# Patient Record
Sex: Female | Born: 1965 | Race: Black or African American | Hispanic: No | Marital: Single | Smoking: Former smoker
Health system: Southern US, Community
[De-identification: ages and names within clinical notes are randomized; demographics above are authoritative.]

## PROBLEM LIST (undated history)

## (undated) DIAGNOSIS — I10 Essential (primary) hypertension: Secondary | ICD-10-CM

## (undated) DIAGNOSIS — E781 Pure hyperglyceridemia: Secondary | ICD-10-CM

## (undated) DIAGNOSIS — E559 Vitamin D deficiency, unspecified: Secondary | ICD-10-CM

## (undated) DIAGNOSIS — T7840XA Allergy, unspecified, initial encounter: Secondary | ICD-10-CM

## (undated) DIAGNOSIS — G43909 Migraine, unspecified, not intractable, without status migrainosus: Secondary | ICD-10-CM

## (undated) DIAGNOSIS — K219 Gastro-esophageal reflux disease without esophagitis: Secondary | ICD-10-CM

## (undated) DIAGNOSIS — E119 Type 2 diabetes mellitus without complications: Secondary | ICD-10-CM

## (undated) DIAGNOSIS — M1990 Unspecified osteoarthritis, unspecified site: Secondary | ICD-10-CM

## (undated) DIAGNOSIS — D649 Anemia, unspecified: Secondary | ICD-10-CM

## (undated) DIAGNOSIS — M719 Bursopathy, unspecified: Secondary | ICD-10-CM

## (undated) DIAGNOSIS — G4730 Sleep apnea, unspecified: Secondary | ICD-10-CM

## (undated) HISTORY — DX: Anemia, unspecified: D64.9

## (undated) HISTORY — DX: Essential (primary) hypertension: I10

## (undated) HISTORY — PX: ABDOMINAL HYSTERECTOMY: SHX81

## (undated) HISTORY — DX: Gastro-esophageal reflux disease without esophagitis: K21.9

## (undated) HISTORY — DX: Vitamin D deficiency, unspecified: E55.9

## (undated) HISTORY — DX: Allergy, unspecified, initial encounter: T78.40XA

## (undated) HISTORY — DX: Sleep apnea, unspecified: G47.30

## (undated) HISTORY — DX: Pure hyperglyceridemia: E78.1

## (undated) HISTORY — DX: Unspecified osteoarthritis, unspecified site: M19.90

---

## 1998-11-14 ENCOUNTER — Other Ambulatory Visit: Admission: RE | Admit: 1998-11-14 | Discharge: 1998-11-14 | Payer: Self-pay | Admitting: *Deleted

## 1999-10-30 ENCOUNTER — Emergency Department (HOSPITAL_COMMUNITY): Admission: EM | Admit: 1999-10-30 | Discharge: 1999-10-30 | Payer: Self-pay | Admitting: Emergency Medicine

## 2000-02-28 ENCOUNTER — Emergency Department (HOSPITAL_COMMUNITY): Admission: EM | Admit: 2000-02-28 | Discharge: 2000-02-29 | Payer: Self-pay | Admitting: Emergency Medicine

## 2000-03-28 ENCOUNTER — Emergency Department (HOSPITAL_COMMUNITY): Admission: EM | Admit: 2000-03-28 | Discharge: 2000-03-28 | Payer: Self-pay | Admitting: *Deleted

## 2003-03-16 ENCOUNTER — Emergency Department (HOSPITAL_COMMUNITY): Admission: EM | Admit: 2003-03-16 | Discharge: 2003-03-16 | Payer: Self-pay | Admitting: Emergency Medicine

## 2004-03-21 ENCOUNTER — Emergency Department (HOSPITAL_COMMUNITY): Admission: EM | Admit: 2004-03-21 | Discharge: 2004-03-22 | Payer: Self-pay | Admitting: Emergency Medicine

## 2004-04-23 ENCOUNTER — Emergency Department (HOSPITAL_COMMUNITY): Admission: EM | Admit: 2004-04-23 | Discharge: 2004-04-23 | Payer: Self-pay | Admitting: Emergency Medicine

## 2004-04-26 ENCOUNTER — Inpatient Hospital Stay (HOSPITAL_COMMUNITY): Admission: EM | Admit: 2004-04-26 | Discharge: 2004-04-28 | Payer: Self-pay | Admitting: Emergency Medicine

## 2004-06-12 ENCOUNTER — Ambulatory Visit (HOSPITAL_COMMUNITY): Admission: RE | Admit: 2004-06-12 | Discharge: 2004-06-12 | Payer: Self-pay | Admitting: Otolaryngology

## 2005-02-12 ENCOUNTER — Emergency Department (HOSPITAL_COMMUNITY): Admission: EM | Admit: 2005-02-12 | Discharge: 2005-02-13 | Payer: Self-pay | Admitting: Emergency Medicine

## 2005-02-25 ENCOUNTER — Other Ambulatory Visit: Admission: RE | Admit: 2005-02-25 | Discharge: 2005-02-25 | Payer: Self-pay | Admitting: Gynecology

## 2005-04-09 ENCOUNTER — Inpatient Hospital Stay (HOSPITAL_COMMUNITY): Admission: RE | Admit: 2005-04-09 | Discharge: 2005-04-11 | Payer: Self-pay | Admitting: Gynecology

## 2005-04-09 ENCOUNTER — Encounter (INDEPENDENT_AMBULATORY_CARE_PROVIDER_SITE_OTHER): Payer: Self-pay | Admitting: Specialist

## 2005-12-31 ENCOUNTER — Emergency Department (HOSPITAL_COMMUNITY): Admission: EM | Admit: 2005-12-31 | Discharge: 2005-12-31 | Payer: Self-pay | Admitting: Emergency Medicine

## 2006-03-03 ENCOUNTER — Other Ambulatory Visit: Admission: RE | Admit: 2006-03-03 | Discharge: 2006-03-03 | Payer: Self-pay | Admitting: Gynecology

## 2006-12-14 ENCOUNTER — Emergency Department (HOSPITAL_COMMUNITY): Admission: EM | Admit: 2006-12-14 | Discharge: 2006-12-14 | Payer: Self-pay | Admitting: Emergency Medicine

## 2006-12-16 ENCOUNTER — Emergency Department (HOSPITAL_COMMUNITY): Admission: EM | Admit: 2006-12-16 | Discharge: 2006-12-16 | Payer: Self-pay | Admitting: Family Medicine

## 2007-11-30 ENCOUNTER — Emergency Department (HOSPITAL_COMMUNITY): Admission: EM | Admit: 2007-11-30 | Discharge: 2007-11-30 | Payer: Self-pay | Admitting: Emergency Medicine

## 2007-12-02 ENCOUNTER — Emergency Department (HOSPITAL_COMMUNITY): Admission: EM | Admit: 2007-12-02 | Discharge: 2007-12-02 | Payer: Self-pay | Admitting: Family Medicine

## 2008-03-17 ENCOUNTER — Emergency Department (HOSPITAL_COMMUNITY): Admission: EM | Admit: 2008-03-17 | Discharge: 2008-03-18 | Payer: Self-pay | Admitting: Emergency Medicine

## 2008-05-27 ENCOUNTER — Emergency Department (HOSPITAL_COMMUNITY): Admission: EM | Admit: 2008-05-27 | Discharge: 2008-05-27 | Payer: Self-pay | Admitting: Emergency Medicine

## 2009-02-28 ENCOUNTER — Emergency Department (HOSPITAL_COMMUNITY): Admission: EM | Admit: 2009-02-28 | Discharge: 2009-02-28 | Payer: Self-pay | Admitting: Family Medicine

## 2009-06-28 ENCOUNTER — Emergency Department (HOSPITAL_COMMUNITY): Admission: EM | Admit: 2009-06-28 | Discharge: 2009-06-28 | Payer: Self-pay | Admitting: Emergency Medicine

## 2009-06-29 ENCOUNTER — Emergency Department (HOSPITAL_COMMUNITY): Admission: EM | Admit: 2009-06-29 | Discharge: 2009-06-29 | Payer: Self-pay | Admitting: Emergency Medicine

## 2009-12-25 ENCOUNTER — Emergency Department (HOSPITAL_COMMUNITY): Admission: EM | Admit: 2009-12-25 | Discharge: 2009-12-25 | Payer: Self-pay | Admitting: Emergency Medicine

## 2010-08-08 LAB — COMPREHENSIVE METABOLIC PANEL
ALT: 55 U/L — ABNORMAL HIGH (ref 0–35)
AST: 40 U/L — ABNORMAL HIGH (ref 0–37)
Albumin: 3.6 g/dL (ref 3.5–5.2)
Alkaline Phosphatase: 67 U/L (ref 39–117)
BUN: 9 mg/dL (ref 6–23)
CO2: 24 mEq/L (ref 19–32)
Calcium: 8.1 mg/dL — ABNORMAL LOW (ref 8.4–10.5)
Chloride: 112 mEq/L (ref 96–112)
Creatinine, Ser: 1.15 mg/dL (ref 0.4–1.2)
GFR calc Af Amer: >60 mL/min (ref >60)
GFR calc non Af Amer: 52 mL/min — ABNORMAL LOW (ref >60)
Glucose, Bld: 96 mg/dL (ref 70–99)
Potassium: 3.1 mEq/L — ABNORMAL LOW (ref 3.5–5.1)
Sodium: 141 mEq/L (ref 135–145)
Total Bilirubin: 0.4 mg/dL (ref 0.3–1.2)
Total Protein: 6.4 g/dL (ref 6.0–8.3)

## 2010-08-08 LAB — CBC
HCT: 38 % (ref 36.0–46.0)
Hemoglobin: 13.1 g/dL (ref 12.0–15.0)
MCHC: 34.6 g/dL (ref 30.0–36.0)
MCV: 87.8 fL (ref 78.0–100.0)
Platelets: 160 10*3/uL (ref 150–400)
RBC: 4.33 MIL/uL (ref 3.87–5.11)
RDW: 13.3 % (ref 11.5–15.5)
WBC: 3.9 10*3/uL — ABNORMAL LOW (ref 4.0–10.5)

## 2010-08-08 LAB — DIFFERENTIAL
Basophils Absolute: 0 10*3/uL (ref 0.0–0.1)
Basophils Relative: 0 % (ref 0–1)
Eosinophils Absolute: 0 10*3/uL (ref 0.0–0.7)
Eosinophils Relative: 1 % (ref 0–5)
Lymphocytes Relative: 19 % (ref 12–46)
Lymphs Abs: 0.7 10*3/uL (ref 0.7–4.0)
Monocytes Absolute: 0.4 10*3/uL (ref 0.1–1.0)
Monocytes Relative: 10 % (ref 3–12)
Neutro Abs: 2.7 10*3/uL (ref 1.7–7.7)
Neutrophils Relative %: 70 % (ref 43–77)

## 2010-10-04 NOTE — Op Note (Signed)
Debra Welch, Debra Welch             ACCOUNT NO.:  [id number]   MEDICAL RECORD NO.:  [id number]          PATIENT TYPE:  INP   LOCATION:  9399                          FACILITY:  [facility]   PHYSICIAN:  Timothy P. Fontaine, M.D.DATE OF BIRTH:  Jul 10, 1965   DATE OF PROCEDURE:  04/09/2005  DATE OF DISCHARGE:                                 OPERATIVE REPORT   PREOPERATIVE DIAGNOSES:  Menometrorrhagia leiomyomata.   POSTOPERATIVE DIAGNOSES:  Menometrorrhagia leiomyomata.   PROCEDURE:  Total abdominal hysterectomy.   SURGEON:  Timothy P. Fontaine, M.D.   ASSISTANT:  Rande Brunt. Eda Paschal, M.D.   ANESTHESIA:  General.   ESTIMATED BLOOD LOSS:  Less than 100 cc.   COMPLICATIONS:  None.   SPECIMEN:  Uterus.   FINDINGS:  Enlarged at 18 weeks size uterus approx 1466 gr, palpable myomas,  right and left fallopian tubes and the right and left ovaries grossly  normal. No evidence of pelvic adhesive disease or endometriosis.  Upper  abdominal exam palpably normal.  Appendix visualized and normal.   PROCEDURE:  The patient was taken to the operating room, underwent an  abdominal perineal vaginal preparation with Betadine scrub and Betadine  solution per nursing personnel, and indwelling Foley catheterization was  performed under sterile technique. 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 The uterus was then elevated  through the incision. The right and left the uterine ovarian pedicles were  identified, doubly clamped, cut and doubly ligated using 0 Vicryl suture.  Balfour retractor and bladder blade were placed within the incision.  The  right round ligament was transected with electrocautery, and the anterior  vesicouterine peritoneal fold was sharply incised, and the bladder flap was  sharply developed. A similar procedure was carried out on the other side.  The right uterine  vessels were then skeletonized, clamped, cut and doubly  ligated using 0 Vicryl suture. A similar procedure was carried out on the  other side.  The supracervical hysterectomy was then performed to allow  visualization of the cervix and subsequently the cervical stump was  progressively freed from its attachments through clamping, cutting and  ligating of the paracervical tissues cardinal ligaments using 0 Vicryl  suture.  The upper vagina was cross clamped, and the cervix was excised  without difficulty. Right and left vaginal angle sutures were placed using 0  Vicryl suture, and the vagina was closed anterior to posterior using two  figure-of-eight interrupted sutures. The pelvis was then copiously  irrigated.  All pedicles reinspected showing adequate hemostasis.  The  previously placed bowel packing was removed.  Balfour retractor and bladder  blade were removed.  The anterior fascia was reapproximated using 0 Vicryl  suture running stitch starting at the angle, meeting in the middle.  Subcutaneous tissues were copiously irrigated.  Adequate hemostasis was  achieved without electrocautery.  Skin reapproximated  using 4-0 Vicryl with a running subcuticular stitch.  Steri-Strips, Benzoin  applied. Sterile dressing applied.  The patient was awakened without  difficulty and taken to  recovery in good condition having tolerated  procedure well with free-flowing clear yellow urine.      Timothy P. Fontaine, M.D.  Electronically Signed     TPF/MEDQ  D:  04/09/2005  T:  04/09/2005  Job:  [id number]

## 2010-10-04 NOTE — Discharge Summary (Signed)
Debra Welch, Debra Welch             ACCOUNT NO.:  [id number]   MEDICAL RECORD NO.:  [id number]          PATIENT TYPE:  INP   LOCATION:  9315                          FACILITY:  [facility]   PHYSICIAN:  Timothy P. Fontaine, M.D.DATE OF BIRTH:  09-Mar-1966   DATE OF ADMISSION:  04/09/2005  DATE OF DISCHARGE:  04/11/2005                                 DISCHARGE SUMMARY   DISCHARGE DIAGNOSES:  1.  Menometrorrhagia.  2.  Leiomyomata.   PROCEDURE:  Total abdominal hysterectomy, April 09, 2005, pathology  pending.   HOSPITAL COURSE:  [age]-year-old G3, P3 female, status post tubal  sterilization with menometrorrhagia and leiomyomata, underwent uncomplicated  total abdominal hysterectomy, April 09, 2005.  The patient's  postoperative course was uncomplicated and she was discharged on  postoperative day #2, ambulating well, tolerating a regular diet, with a  postoperative hemoglobin of 7.5.  Her preoperative hemoglobin was 9.1.  The  patient received precautions, instructions and followup.  She will be seen  in the office in 2 weeks.   DISCHARGE MEDICATIONS:  1.  To resume oral iron as an outpatient.   Received a prescription for:  1.  Tylox #20, one to two p.o. q.4-6 h.  2.  Motrin 800 mg one p.o. q.8 h. p.r.n. pain, #30,  3.  Ambien at her request, 10 mg, #15, one p.o. nightly p.r.n. insomnia.      Timothy P. Fontaine, M.D.  Electronically Signed     TPF/MEDQ  D:  04/11/2005  T:  04/11/2005  Job:  [id number]

## 2010-10-04 NOTE — H&P (Signed)
NAME:  Debra Welch, Debra Welch             ACCOUNT NO.:  [id number]   MEDICAL RECORD NO.:  [id number]          PATIENT TYPE:  INP   LOCATION:  NA                            FACILITY:  [facility]   PHYSICIAN:  Timothy P. Fontaine, M.D.DATE OF BIRTH:  01-06-1966   DATE OF ADMISSION:  04/09/2005  DATE OF DISCHARGE:                                HISTORY & PHYSICAL   CHIEF COMPLAINT:  Menometrorrhagia.   HISTORY OF PRESENT ILLNESS:  A 45 year old G18, P3 female with a history of  menometrorrhagia, leiomyomata for TAH. The patient was evaluated in the  emergency room due to heavy frequent bleeding.  The patient was found to  have probable leiomyomata and was referred for evaluation. Exam revealed a  16 week size, irregular uterus, with ultrasound confirming multiple myomas,  and she is admitted at this time for TAH. She is status post postpartum BTL.  She is currently on Megace 40 milligrams daily for menstrual suppression.   PAST MEDICAL HISTORY:  Uncomplicated.   PAST SURGICAL HISTORY:  Tubal sterilization in 1992.   ALLERGIES:  DEMEROL.   REVIEW OF SYSTEMS:  Noncontributory.   FAMILY HISTORY:  Noncontributory.   SOCIAL HISTORY:  Noncontributory.   CURRENT MEDICATIONS:  1.  Megace 40 milligrams daily.  2.  Iron supplementation.   PHYSICAL EXAMINATION:  VITAL SIGNS:  Afebrile. Vital signs are stable.  HEENT: Normal.  LUNGS:  Clear.  CARDIAC:  Regular rate.  No rubs, murmurs, or gallops.  ABDOMEN:  Benign.  PELVIC:  External BUS, vagina normal. Cervix normal. Uterus 16 week size,  bulky.  Adnexa without masses or tenderness.   ASSESSMENT:  A 45 year old G56, P3 female with menometrorrhagia, leiomyomata  for TAH. I reviewed the risks, benefits, indications and alternatives for  the proposed surgery to include the expected intraoperative and  postoperative courses. She understands that hysterectomy is absolute and  irreversible sterility. We also discussed sexuality following hysterectomy  and the possibilities of persistent orgasmic dysfunction, as well as  persistent dyspareunia was discussed, understood, and accepted. The ovarian  conservation issue was also discussed with her, and the issues of keeping  one or both ovaries versus removing them were reviewed, understood, and  accepted. She understands that by keeping her ovaries she is at risk for  ovarian disease in the future to include both benign and ovarian carcinoma,  and she understands by removing them she is at risk for hypoestrogenic  symptoms, ERT and risks of ERT including stroke, heart attack, DVT and  breast cancer issues. The patient desires to keep both ovaries, although she  does give me permission to remove one or both ovaries at the time of surgery  if, in my judgment, it is wise to do so. I reviewed with her also the  realistic risk of transfusion. Her hemoglobin was 8.3.  Previously we have  suppressed her on Megace, although she was to be taking extra iron.  She  relates not having done this on a consistent basis. I reviewed with her the  risks of transfusion to include transfusion reaction, hepatitis, HIV, mad  cow disease and other  unknown entities.  The possibilities of postponing  surgery to achieve maximum hemoglobin was discussed, and she, due to her  continued pain and bleeding, wants to proceed with surgery, and she accepts  the realistic risk of transfusion. I reviewed the acute intraoperative,  postoperative risks of infection both internal, requiring prolonged  antibiotics, as well as abscess formation to include reoperation, abscess  drainage, cuff hematoma, and drainage, as well as wound complications  including opening and draining of incisions and closure by secondary  intention. I reviewed with her the wound placement, both Pfannenstiel as  well as midline, and she understands that we will make the final decision at  the time of surgery when she is on the operating table. 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 She understands the issues and gives me permission for either  incision. The risks of inadvertent injury to internal organs including  bowel, bladder, ureters, vessels and nerves necessitating major reparative  surgeries and future reparative surgeries including ostomy formation was all  discussed, understood and accepted. The risks of thrombosis, DVT, pulmonary  emboli were also discussed, understood and accepted. The patient's questions  were answered to her satisfaction, and she is ready to proceed with surgery.      Timothy P. Fontaine, M.D.  Electronically Signed     TPF/MEDQ  D:  04/07/2005  T:  04/07/2005  Job:  [id number]

## 2011-03-03 LAB — URINALYSIS, ROUTINE W REFLEX MICROSCOPIC
Bilirubin Urine: NEGATIVE
Glucose, UA: NEGATIVE
Hgb urine dipstick: NEGATIVE
Ketones, ur: NEGATIVE
Nitrite: POSITIVE — AB
Protein, ur: 30 — AB
Specific Gravity, Urine: 1.015
Urobilinogen, UA: 0.2
pH: 8.5 — ABNORMAL HIGH

## 2011-03-03 LAB — URINE MICROSCOPIC-ADD ON

## 2011-03-03 LAB — POCT PREGNANCY, URINE
Operator id: 285841
Preg Test, Ur: NEGATIVE

## 2012-01-15 ENCOUNTER — Encounter: Payer: Self-pay | Admitting: Family Medicine

## 2012-04-01 ENCOUNTER — Emergency Department (HOSPITAL_COMMUNITY)
Admission: EM | Admit: 2012-04-01 | Discharge: 2012-04-01 | Disposition: A | Discharge status: Home / Self Care | Payer: PRIVATE HEALTH INSURANCE | Attending: Emergency Medicine | Admitting: Emergency Medicine

## 2012-04-01 ENCOUNTER — Encounter (HOSPITAL_COMMUNITY): Payer: Self-pay | Admitting: Emergency Medicine

## 2012-04-01 ENCOUNTER — Emergency Department (HOSPITAL_COMMUNITY): Payer: PRIVATE HEALTH INSURANCE

## 2012-04-01 DIAGNOSIS — X500XXA Overexertion from strenuous movement or load, initial encounter: Secondary | ICD-10-CM | POA: Insufficient documentation

## 2012-04-01 DIAGNOSIS — Z79899 Other long term (current) drug therapy: Secondary | ICD-10-CM | POA: Insufficient documentation

## 2012-04-01 DIAGNOSIS — S4980XA Other specified injuries of shoulder and upper arm, unspecified arm, initial encounter: Secondary | ICD-10-CM | POA: Insufficient documentation

## 2012-04-01 DIAGNOSIS — Y929 Unspecified place or not applicable: Secondary | ICD-10-CM | POA: Insufficient documentation

## 2012-04-01 DIAGNOSIS — F17200 Nicotine dependence, unspecified, uncomplicated: Secondary | ICD-10-CM | POA: Insufficient documentation

## 2012-04-01 DIAGNOSIS — Y9389 Activity, other specified: Secondary | ICD-10-CM | POA: Insufficient documentation

## 2012-04-01 DIAGNOSIS — M25519 Pain in unspecified shoulder: Secondary | ICD-10-CM

## 2012-04-01 DIAGNOSIS — S46909A Unspecified injury of unspecified muscle, fascia and tendon at shoulder and upper arm level, unspecified arm, initial encounter: Secondary | ICD-10-CM | POA: Insufficient documentation

## 2012-04-01 MED ORDER — TRAMADOL HCL 50 MG PO TABS: 50.0000 mg | ORAL_TABLET | Freq: Four times a day (QID) | ORAL | Status: DC | PRN

## 2012-04-01 MED ORDER — IBUPROFEN 400 MG PO TABS
800.0000 mg | ORAL_TABLET | Freq: Once | ORAL | Status: AC
  Administered 2012-04-01: 800 mg via ORAL
  Filled 2012-04-01: qty 2

## 2012-04-01 MED ORDER — IBUPROFEN 200 MG PO TABS: 600.0000 mg | ORAL_TABLET | Freq: Three times a day (TID) | ORAL | Status: AC

## 2012-04-01 NOTE — ED Provider Notes (Signed)
History  Scribed for Gerhard Munch, MD, the patient was seen in room TR07C/TR07C. This chart was scribed by Candelaria Stagers. The patient's care started at 11:26 AM   CSN: [id number]  Arrival date & time 04/01/12  1027   First MD Initiated Contact with Patient 04/01/12 1108      Chief Complaint  Patient presents with  . Neck Pain     The history is provided by the patient. No language interpreter was used.   Debra Welch is a 46 y.o. female who presents to the Emergency Department complaining of neck pain that started last night after reaching for something and feeling a snap in the left shoulder.  She states that the pain has gotten worse since then.  She is now experiencing pain down the left arm,  tingling in the fingers, and swelling to the left hand.  She has taken aleve with no relief.     She denies any CP / dyspnea / LH / syncope / near-syncope. History reviewed. No pertinent past medical history.  Past Surgical History  Procedure Date  . Abdominal hysterectomy     No family history on file.  History  Substance Use Topics  . Smoking status: Current Every Day Smoker -- 0.5 packs/day    Types: Cigarettes  . Smokeless tobacco: Not on file  . Alcohol Use: No    OB History    Grav Para Term Preterm Abortions TAB SAB Ect Mult Living                  Review of Systems  HENT: Positive for neck pain.   Gastrointestinal: Negative for vomiting.  Musculoskeletal: Positive for arthralgias (left shoulder pain).  Neurological: Positive for numbness (left fingers). Negative for syncope.  All other systems reviewed and are negative.    Allergies  Demerol  Home Medications   Current Outpatient Rx  Name  Route  Sig  Dispense  Refill  . NAPROXEN SODIUM 220 MG PO TABS   Oral   Take 440 mg by mouth 2 (two) times daily as needed. For pain         . DAYQUIL PO   Oral   Take 2 capsules by mouth daily as needed. For cough/cold           BP 143/84  Pulse  86  Temp 97.7 F (36.5 C) (Oral)  Resp 20  Ht 5' 3.5" (1.613 m)  Wt 212 lb (96.163 kg)  BMI 36.97 kg/m2  SpO2 96%  Physical Exam  Nursing note and vitals reviewed. Constitutional: She is oriented to person, place, and time. She appears well-developed and well-nourished. No distress.  HENT:  Head: Normocephalic and atraumatic.  Eyes: EOM are normal.  Neck: Neck supple. No tracheal deviation present.  Cardiovascular: Normal rate.   Pulmonary/Chest: Effort normal. No respiratory distress.  Musculoskeletal: Normal range of motion.       Pain on the [facility] joint and lateral left shoulder.  Minimally diminished grip strength on the left (2/2 pain).  Mild swelling to the superior aspect of the left shoulder.  Symmetric strength with internal and external rotation.  Will not extend the shoulder secondary to pain.  No elbow pain.  No wrist pain.      Neurological: She is alert and oriented to person, place, and time.  Skin: Skin is warm and dry.  Psychiatric: She has a normal mood and affect. Her behavior is normal.    ED Course  Procedures  DIAGNOSTIC STUDIES: Oxygen Saturation is 96% on room air, normal by my interpretation.    COORDINATION OF CARE:  11:35AM Ordered: DG Shoulder Left  Labs Reviewed - No data to display Dg Shoulder Left  04/01/2012  *RADIOLOGY REPORT*  Clinical Data: Injury  LEFT SHOULDER - 2+ VIEW  Comparison: None.  Findings: Three views of the left shoulder submitted.  No acute fracture or subluxation.  Glenohumeral joint is preserved.  IMPRESSION: No acute fracture or subluxation.   Original Report Authenticated By: Natasha Mead, M.D.        No diagnosis found.    MDM  I personally performed the services described in this documentation, which was scribed in my presence. The recorded information has been reviewed and is accurate.  This patient presents with new left shoulder pain and distal dysesthesia.  On exam she is in no distress.  The patient has  unremarkable vital signs.  The patient does have appropriate neurovascular status, though she has pain throughout the extremity.  Given the preserved neurologic function, though with her ongoing complaints or suspicion of brachial plexus strain versus muscle strain with adjacent inflammation.  Absent distress, the patient was discharged with anti-inflammatories, cryotherapy, orthopedics followup.   Gerhard Munch, MD 04/01/12 [phone]

## 2012-04-01 NOTE — ED Notes (Signed)
Last night, reached for something, felt "pop" in left neck, pain down left arm, tingling in fingers. Today left hand swollen and increasing pain.

## 2012-07-08 ENCOUNTER — Encounter (HOSPITAL_COMMUNITY): Payer: Self-pay | Admitting: *Deleted

## 2012-07-08 ENCOUNTER — Emergency Department (HOSPITAL_COMMUNITY)
Admission: EM | Admit: 2012-07-08 | Discharge: 2012-07-08 | Disposition: A | Discharge status: Home / Self Care | Payer: PRIVATE HEALTH INSURANCE | Attending: Emergency Medicine | Admitting: Emergency Medicine

## 2012-07-08 DIAGNOSIS — H53149 Visual discomfort, unspecified: Secondary | ICD-10-CM | POA: Insufficient documentation

## 2012-07-08 DIAGNOSIS — IMO0002 Reserved for concepts with insufficient information to code with codable children: Secondary | ICD-10-CM | POA: Insufficient documentation

## 2012-07-08 DIAGNOSIS — Y929 Unspecified place or not applicable: Secondary | ICD-10-CM | POA: Insufficient documentation

## 2012-07-08 DIAGNOSIS — S0590XA Unspecified injury of unspecified eye and orbit, initial encounter: Secondary | ICD-10-CM | POA: Insufficient documentation

## 2012-07-08 DIAGNOSIS — Z23 Encounter for immunization: Secondary | ICD-10-CM | POA: Insufficient documentation

## 2012-07-08 DIAGNOSIS — F17200 Nicotine dependence, unspecified, uncomplicated: Secondary | ICD-10-CM | POA: Insufficient documentation

## 2012-07-08 DIAGNOSIS — S0592XA Unspecified injury of left eye and orbit, initial encounter: Secondary | ICD-10-CM

## 2012-07-08 DIAGNOSIS — R220 Localized swelling, mass and lump, head: Secondary | ICD-10-CM | POA: Insufficient documentation

## 2012-07-08 DIAGNOSIS — Y9383 Activity, rough housing and horseplay: Secondary | ICD-10-CM | POA: Insufficient documentation

## 2012-07-08 DIAGNOSIS — H5789 Other specified disorders of eye and adnexa: Secondary | ICD-10-CM | POA: Insufficient documentation

## 2012-07-08 MED ORDER — PROPARACAINE HCL 0.5 % OP SOLN
1.0000 [drp] | Freq: Once | OPHTHALMIC | Status: AC
  Administered 2012-07-08: 1 [drp] via OPHTHALMIC
  Filled 2012-07-08: qty 15

## 2012-07-08 MED ORDER — HYDROCODONE-ACETAMINOPHEN 5-325 MG PO TABS
1.0000 | ORAL_TABLET | Freq: Once | ORAL | Status: AC
  Administered 2012-07-08: 1 via ORAL
  Filled 2012-07-08: qty 1

## 2012-07-08 MED ORDER — POLYMYXIN B-TRIMETHOPRIM 10000-0.1 UNIT/ML-% OP SOLN
1.0000 [drp] | OPHTHALMIC | Status: DC
  Administered 2012-07-08: 1 [drp] via OPHTHALMIC
  Filled 2012-07-08: qty 10

## 2012-07-08 MED ORDER — TETANUS-DIPHTH-ACELL PERTUSSIS 5-2.5-18.5 LF-MCG/0.5 IM SUSP
0.5000 mL | Freq: Once | INTRAMUSCULAR | Status: AC
  Administered 2012-07-08: 0.5 mL via INTRAMUSCULAR
  Filled 2012-07-08 (×2): qty 0.5

## 2012-07-08 MED ORDER — FLUORESCEIN SODIUM 1 MG OP STRP
1.0000 | ORAL_STRIP | Freq: Once | OPHTHALMIC | Status: AC
  Administered 2012-07-08: 1 via OPHTHALMIC
  Filled 2012-07-08: qty 1

## 2012-07-08 MED ORDER — HYDROCODONE-ACETAMINOPHEN 5-325 MG PO TABS: 1.0000 | ORAL_TABLET | Freq: Four times a day (QID) | ORAL | Status: DC | PRN
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
## 2012-07-08 NOTE — ED Notes (Signed)
Still waiting on eyedrops from pharmacy.

## 2012-07-09 NOTE — ED Provider Notes (Signed)
Medical screening examination/treatment/procedure(s) were performed by non-physician practitioner and as supervising physician I was immediately available for consultation/collaboration.   Shelda Jakes, MD 07/09/12 [phone]

## 2012-11-17 ENCOUNTER — Encounter (HOSPITAL_COMMUNITY): Payer: Self-pay | Admitting: Emergency Medicine

## 2012-11-17 ENCOUNTER — Emergency Department (HOSPITAL_COMMUNITY)
Admission: EM | Admit: 2012-11-17 | Discharge: 2012-11-17 | Disposition: A | Discharge status: Home / Self Care | Payer: PRIVATE HEALTH INSURANCE | Attending: Emergency Medicine | Admitting: Emergency Medicine

## 2012-11-17 DIAGNOSIS — Z79899 Other long term (current) drug therapy: Secondary | ICD-10-CM | POA: Insufficient documentation

## 2012-11-17 DIAGNOSIS — H6120 Impacted cerumen, unspecified ear: Secondary | ICD-10-CM | POA: Insufficient documentation

## 2012-11-17 DIAGNOSIS — H6121 Impacted cerumen, right ear: Secondary | ICD-10-CM

## 2012-11-17 DIAGNOSIS — H9190 Unspecified hearing loss, unspecified ear: Secondary | ICD-10-CM | POA: Insufficient documentation

## 2012-11-17 DIAGNOSIS — R509 Fever, unspecified: Secondary | ICD-10-CM | POA: Insufficient documentation

## 2012-11-17 DIAGNOSIS — J029 Acute pharyngitis, unspecified: Secondary | ICD-10-CM | POA: Insufficient documentation

## 2012-11-17 DIAGNOSIS — F17200 Nicotine dependence, unspecified, uncomplicated: Secondary | ICD-10-CM | POA: Insufficient documentation

## 2012-11-17 DIAGNOSIS — R51 Headache: Secondary | ICD-10-CM | POA: Insufficient documentation

## 2012-11-17 MED ORDER — AMOXICILLIN 500 MG PO CAPS: 500.0000 mg | ORAL_CAPSULE | Freq: Three times a day (TID) | ORAL | Status: DC

## 2012-11-17 MED ORDER — DOCUSATE SODIUM 50 MG/5ML PO LIQD
50.0000 mg | Freq: Once | ORAL | Status: AC
  Administered 2012-11-17: 50 mg via OTIC
  Filled 2012-11-17: qty 10

## 2012-11-17 NOTE — ED Notes (Signed)
Med ordered from pharmacy.
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
## 2012-11-17 NOTE — ED Notes (Signed)
Pt c/o right ear pain x 1 week worse x 1 days; pt sts got water in ear

## 2012-11-17 NOTE — ED Provider Notes (Signed)
History    CSN: [id number] Arrival date & time 11/17/12  1216  First MD Initiated Contact with Patient 11/17/12 1317     Chief Complaint  Patient presents with  . Otalgia   (Consider location/radiation/quality/duration/timing/severity/associated sxs/prior Treatment) Patient is a 47 y.o. female presenting with ear pain. The history is provided by the patient and medical records. No language interpreter was used.  Otalgia Location:  Right Duration:  2 weeks Timing:  Constant Progression:  Partially resolved Chronicity:  Recurrent Context: water   Relieved by:  Nothing Exacerbated by: light. Ineffective treatments:  None tried Associated symptoms: fever, headaches, hearing loss and sore throat   Associated symptoms: no abdominal pain, no congestion, no cough, no diarrhea, no ear discharge, no neck pain, no rash, no rhinorrhea, no tinnitus and no vomiting   Risk factors: no recent travel and no chronic ear infection     Debra Welch is a 47 y.o. female  with a hx of hysterectomy presents to the Emergency Department complaining of gradual, persistent, progressively worsening L ear pain with associated ear pressure and headache beginning 2 weeks ago after swimming and acutely worsening today. Associated symptoms include otalgia, headache, ear pressure, subjective fever.  Nothing makes it better and light makes it worse.  Pt denies chills, neck pain, chest pain, SOB, Abd pain, N/V/D, weakness.    Pt has seen an ENT for ear infections before and was hospitalized for an OE.      History reviewed. No pertinent past medical history. Past Surgical History  Procedure Laterality Date  . Abdominal hysterectomy     History reviewed. No pertinent family history. History  Substance Use Topics  . Smoking status: Current Every Day Smoker -- 0.50 packs/day    Types: Cigarettes  . Smokeless tobacco: Not on file  . Alcohol Use: No   OB History   Grav Para Term Preterm Abortions TAB SAB  Ect Mult Living                 Review of Systems  Constitutional: Positive for fever. Negative for chills, appetite change and fatigue.  HENT: Positive for hearing loss, ear pain and sore throat. Negative for congestion, rhinorrhea, mouth sores, neck pain, neck stiffness, postnasal drip, sinus pressure, tinnitus and ear discharge.   Eyes: Negative for visual disturbance.  Respiratory: Negative for cough, chest tightness, shortness of breath, wheezing and stridor.   Cardiovascular: Negative for chest pain, palpitations and leg swelling.  Gastrointestinal: Negative for nausea, vomiting, abdominal pain and diarrhea.  Genitourinary: Negative for dysuria, urgency, frequency and hematuria.  Musculoskeletal: Negative for myalgias, back pain and arthralgias.  Skin: Negative for rash.  Neurological: Positive for headaches. Negative for syncope, light-headedness and numbness.  Hematological: Negative for adenopathy.  Psychiatric/Behavioral: The patient is not nervous/anxious.   All other systems reviewed and are negative.    Allergies  Demerol  Home Medications   Current Outpatient Rx  Name  Route  Sig  Dispense  Refill  . amoxicillin (AMOXIL) 500 MG capsule   Oral   Take 1 capsule (500 mg total) by mouth 3 (three) times daily.   30 capsule   0    BP 124/79  Pulse 83  Temp(Src) 99 F (37.2 C) (Oral)  Resp 18  SpO2 99% Physical Exam  Constitutional: She is oriented to person, place, and time. She appears well-developed and well-nourished. No distress.  HENT:  Head: Normocephalic and atraumatic.  Left Ear: Tympanic membrane, external ear and ear canal  normal.  Nose: No mucosal edema or rhinorrhea. No epistaxis. Right sinus exhibits no maxillary sinus tenderness and no frontal sinus tenderness. Left sinus exhibits no maxillary sinus tenderness and no frontal sinus tenderness.  Mouth/Throat: Uvula is midline and mucous membranes are normal. Mucous membranes are not pale and not  cyanotic. No oropharyngeal exudate, posterior oropharyngeal edema, posterior oropharyngeal erythema or tonsillar abscesses.  Right ear canal impacted with cerumen Right sided tonsilar edema and erythema  Eyes: Conjunctivae are normal. Pupils are equal, round, and reactive to light.  Neck: Normal range of motion and full passive range of motion without pain.  Cardiovascular: Normal rate and intact distal pulses.   Pulmonary/Chest: Effort normal and breath sounds normal. No stridor.  Abdominal: Soft. Bowel sounds are normal. There is no tenderness.  Musculoskeletal: Normal range of motion.  Lymphadenopathy:    She has no cervical adenopathy.  Neurological: She is alert and oriented to person, place, and time.  Skin: Skin is dry. No rash noted. She is not diaphoretic.  Psychiatric: She has a normal mood and affect.    ED Course  Procedures (including critical care time) Labs Reviewed - No data to display No results found. 1. Cerumen impaction, right     MDM  Debra Welch presents with ear pain and cerumen impaction.  Likely OM behind cerumen.  Will proceed with ear wax removal.    Patient presents with otalgia.  Unable to remove cerumen in the ED.  Will treat for OM.  No concern for acute mastoiditis, meningitis.  No antibiotic use in the last month.  Patient discharged home with Amoxicillin.   Advised Pt to f/u with ENT for further evaluation.  I have also discussed reasons to return immediately to the ER.  Pt expresses understanding and agrees with plan.  Debra Client Saren Corkern, PA-C 11/17/12 1516

## 2012-11-17 NOTE — ED Provider Notes (Signed)
Medical screening examination/treatment/procedure(s) were performed by non-physician practitioner and as supervising physician I was immediately available for consultation/collaboration.   Gwyneth Sprout, MD 11/17/12 1531

## 2012-11-17 NOTE — ED Notes (Signed)
Right ear washed out with NS. Additional Colace instilled in right ear.

## 2012-11-17 NOTE — ED Notes (Signed)
Right ear irrigated with 240cc warm water and peroxide. Unable to remove wax. Additional Colace instilled.

## 2013-08-24 ENCOUNTER — Encounter (HOSPITAL_COMMUNITY): Payer: Self-pay | Admitting: Emergency Medicine

## 2013-08-24 ENCOUNTER — Emergency Department (HOSPITAL_COMMUNITY): Payer: PRIVATE HEALTH INSURANCE

## 2013-08-24 ENCOUNTER — Emergency Department (HOSPITAL_COMMUNITY)
Admission: EM | Admit: 2013-08-24 | Discharge: 2013-08-24 | Disposition: A | Discharge status: Home / Self Care | Payer: PRIVATE HEALTH INSURANCE | Attending: Emergency Medicine | Admitting: Emergency Medicine

## 2013-08-24 DIAGNOSIS — F17200 Nicotine dependence, unspecified, uncomplicated: Secondary | ICD-10-CM | POA: Insufficient documentation

## 2013-08-24 DIAGNOSIS — M259 Joint disorder, unspecified: Secondary | ICD-10-CM

## 2013-08-24 DIAGNOSIS — M65839 Other synovitis and tenosynovitis, unspecified forearm: Secondary | ICD-10-CM | POA: Insufficient documentation

## 2013-08-24 DIAGNOSIS — M778 Other enthesopathies, not elsewhere classified: Secondary | ICD-10-CM

## 2013-08-24 DIAGNOSIS — Z792 Long term (current) use of antibiotics: Secondary | ICD-10-CM | POA: Insufficient documentation

## 2013-08-24 DIAGNOSIS — M65849 Other synovitis and tenosynovitis, unspecified hand: Principal | ICD-10-CM

## 2013-08-24 MED ORDER — HYDROCODONE-ACETAMINOPHEN 5-325 MG PO TABS: 1.0000 | ORAL_TABLET | ORAL | Status: DC | PRN

## 2013-08-24 MED ORDER — MELOXICAM 15 MG PO TABS: 15.0000 mg | ORAL_TABLET | Freq: Every day | ORAL | Status: DC

## 2013-08-24 NOTE — Progress Notes (Signed)
Orthopedic Tech Progress Note Patient Details:  Debra Welch Sep 29, 1965 [id number]  Ortho Devices Type of Ortho Device: Velcro wrist splint Ortho Device/Splint Location: RUE Ortho Device/Splint Interventions: Ordered;Application   Jennye MoccasinAnthony Craig [name] 08/24/2013, 8:37 PM

## 2013-08-24 NOTE — ED Notes (Signed)
Per pt right wrist pain. Denies injury. sts pain in wrist and hand swelling.

## 2013-08-24 NOTE — ED Provider Notes (Signed)
CSN: [id number]     Arrival date & time 08/24/13  1802 History  This chart was scribed for non-physician practitioner, Arthor CaptainAbigail Benicia Bergevin, PA-C, working with Toy BakerAnthony T Allen, MD by Charline BillsEssence Howell, ED Scribe. This patient was seen in room TR07C/TR07C and the patient's care was started at 8:07 PM.      Chief Complaint  Patient presents with  . Wrist Injury    The history is provided by the patient. No language interpreter was used.   HPI Comments: Debra Welch is a 48 y.o. female who presents to the Emergency Department complaining of aching, throbbing R wrist pain. Pt reports a "popping" sound in her wrist followed by pain onset 3 days ago. Pt states that the pain radiates "internally" and feels "expansive". She also reports associated numbness. Pt is able to wiggle her fingers. Pt has tried ibuprofen (600 mg) every 4-6 hours, hot and cold compresses with no relief.   History reviewed. No pertinent past medical history. Past Surgical History  Procedure Laterality Date  . Abdominal hysterectomy     History reviewed. No pertinent family history. History  Substance Use Topics  . Smoking status: Current Every Day Smoker -- 0.50 packs/day    Types: Cigarettes  . Smokeless tobacco: Not on file  . Alcohol Use: No   OB History   Grav Para Term Preterm Abortions TAB SAB Ect Mult Living                 Review of Systems  Musculoskeletal: Positive for arthralgias.  All other systems reviewed and are negative.    Allergies  Demerol  Home Medications   Current Outpatient Rx  Name  Route  Sig  Dispense  Refill  . amoxicillin (AMOXIL) 500 MG capsule   Oral   Take 1 capsule (500 mg total) by mouth 3 (three) times daily.   30 capsule   0    Triage Vitals: BP 144/82  Pulse 111  Temp(Src) 98 F (36.7 C) (Oral)  Resp 18  Wt 189 lb (85.73 kg)  SpO2 100% Physical Exam  Nursing note and vitals reviewed. Constitutional: She is oriented to person, place, and time. She appears  well-developed and well-nourished.  HENT:  Head: Normocephalic and atraumatic.  Right Ear: External ear normal.  Left Ear: External ear normal.  Mouth/Throat: Oropharynx is clear and moist.  Eyes: Conjunctivae and EOM are normal.  Neck: Normal range of motion. Neck supple.  Cardiovascular: Normal rate, normal heart sounds and intact distal pulses.   Pulmonary/Chest: Effort normal and breath sounds normal.  Abdominal: Soft. Bowel sounds are normal. There is no tenderness. There is no rebound.  Musculoskeletal: Normal range of motion.  Tenderness & swelling over R dorsal lateral surface of wrist No crepitus Popping with movement Pain with ulnar deviation and supination of the wrist  Neurological: She is alert and oriented to person, place, and time.  Skin: Skin is warm.    ED Course  Procedures (including critical care time) DIAGNOSTIC STUDIES: Oxygen Saturation is 100% on RA, normal by my interpretation.    COORDINATION OF CARE: 8:17 PM-Discussed treatment plan which includes a brace for support and an anti-inflammatory with pt at bedside and pt agreed to plan.   Labs Review Labs Reviewed - No data to display Imaging Review Dg Wrist Complete Right  08/24/2013   CLINICAL DATA:  Right wrist pain and swelling.  EXAM: RIGHT WRIST - COMPLETE 3+ VIEW  COMPARISON:  None.  FINDINGS: The joint spaces are  maintained.  No acute fracture.  IMPRESSION: No acute bony findings or arthropathic changes.   Electronically Signed   By: Loralie Champagne M.D.   On: 08/24/2013 19:24   Dg Hand Complete Right  08/24/2013   CLINICAL DATA:  Hand pain.  EXAM: RIGHT HAND - COMPLETE 3+ VIEW  COMPARISON:  None.  FINDINGS: The joint spaces are maintained. No acute fracture or arthropathic changes.  IMPRESSION: No acute bony findings.   Electronically Signed   By: Loralie Champagne M.D.   On: 08/24/2013 19:25     EKG Interpretation None      MDM   Final diagnoses:  Right wrist tendinitis  Wrist clicking     Patient X-Ray negative for obvious fracture or dislocation. Pain managed in ED. Pt advised to follow up with orthopedics if symptoms persist for possibility of missed fracture diagnosis. Patient given brace while in ED, conservative therapy recommended and discussed. Patient will be dc home & is agreeable with above plan.   I personally performed the services described in this documentation, which was scribed in my presence. The recorded information has been reviewed and is accurate.      Arthor Captain, PA-C 08/30/13 1836

## 2013-08-24 NOTE — Discharge Instructions (Signed)
. Tendinitis Tendinitis is swelling and inflammation of the tendons. Tendons are band-like tissues that connect muscle to bone. Tendinitis commonly occurs in the:   Shoulders (rotator cuff).  Heels (Achilles tendon).  Elbows (triceps tendon). CAUSES Tendinitis is usually caused by overusing the tendon, muscles, and joints involved. When the tissue surrounding a tendon (synovium) becomes inflamed, it is called tenosynovitis. Tendinitis commonly develops in people whose jobs require repetitive motions. SYMPTOMS  Pain.  Tenderness.  Mild swelling. DIAGNOSIS Tendinitis is usually diagnosed by physical exam. Your caregiver may also order X-rays or other imaging tests. TREATMENT Your caregiver may recommend certain medicines or exercises for your treatment. HOME CARE INSTRUCTIONS   Use a sling or splint for as long as directed by your caregiver until the pain decreases.  Put ice on the injured area.  Put ice in a plastic bag.  Place a towel between your skin and the bag.  Leave the ice on for 15-20 minutes, 03-04 times a day.  Avoid using the limb while the tendon is painful. Perform gentle range of motion exercises only as directed by your caregiver. Stop exercises if pain or discomfort increase, unless directed otherwise by your caregiver.  Only take over-the-counter or prescription medicines for pain, discomfort, or fever as directed by your caregiver. SEEK MEDICAL CARE IF:   Your pain and swelling increase.  You develop new, unexplained symptoms, especially increased numbness in the hands. MAKE SURE YOU:   Understand these instructions.  Will watch your condition.  Will get help right away if you are not doing well or get worse. Document Released: 05/02/2000 Document Revised: 07/28/2011 Document Reviewed: 07/22/2010 [facility] Patient Information 2014 [place], [place]. Wrist Pain Wrist injuries are frequent in adults and children. A sprain is an injury to the ligaments  that hold your bones together. A strain is an injury to muscle or muscle cord-like structures (tendons) from stretching or pulling. Generally, when wrists are moderately tender to touch following a fall or injury, a break in the bone (fracture) may be present. Most wrist sprains or strains are better in 3 to 5 days, but complete healing may take several weeks. HOME CARE INSTRUCTIONS   Put ice on the injured area.  Put ice in a plastic bag.  Place a towel between your skin and the bag.  Leave the ice on for 15-20 minutes, 03-04 times a day, for the first 2 days.  Keep your arm raised above the level of your heart whenever possible to reduce swelling and pain.  Rest the injured area for at least 48 hours or as directed by your caregiver.  If a splint or elastic bandage has been applied, use it for as long as directed by your caregiver or until seen by a caregiver for a follow-up exam.  Only take over-the-counter or prescription medicines for pain, discomfort, or fever as directed by your caregiver.  Keep all follow-up appointments. You may need to follow up with a specialist or have follow-up X-rays. Improvement in pain level is not a guarantee that you did not fracture a bone in your wrist. The only way to determine whether or not you have a broken bone is by X-ray. SEEK IMMEDIATE MEDICAL CARE IF:   Your fingers are swollen, very red, white, or cold and blue.  Your fingers are numb or tingling.  You have increasing pain.  You have difficulty moving your fingers. MAKE SURE YOU:   Understand these instructions.  Will watch your condition.  Will get help  right away if you are not doing well or get worse. Document Released: 02/12/2005 Document Revised: 07/28/2011 Document Reviewed: 06/26/2010 [facility] Patient Information 2014 [place], [place].

## 2013-08-24 NOTE — ED Notes (Signed)
PT comfortable with discharge and follow up instructions. Prescriptions x2. 

## 2013-09-02 NOTE — ED Provider Notes (Signed)
Medical screening examination/treatment/procedure(s) were performed by non-physician practitioner and as supervising physician I was immediately available for consultation/collaboration.   EKG Interpretation None       Keturah Yerby T Narda Fundora, MD 09/02/13 1819 

## 2013-11-06 ENCOUNTER — Inpatient Hospital Stay (HOSPITAL_COMMUNITY)
Admission: AD | Admit: 2013-11-06 | Discharge: 2013-11-06 | Disposition: A | Discharge status: Home / Self Care | Payer: PRIVATE HEALTH INSURANCE | Source: Ambulatory Visit | Attending: Obstetrics and Gynecology | Admitting: Obstetrics and Gynecology

## 2013-11-06 ENCOUNTER — Encounter (HOSPITAL_COMMUNITY): Payer: Self-pay | Admitting: *Deleted

## 2013-11-06 DIAGNOSIS — F17200 Nicotine dependence, unspecified, uncomplicated: Secondary | ICD-10-CM | POA: Insufficient documentation

## 2013-11-06 DIAGNOSIS — Z202 Contact with and (suspected) exposure to infections with a predominantly sexual mode of transmission: Secondary | ICD-10-CM

## 2013-11-06 LAB — URINALYSIS, ROUTINE W REFLEX MICROSCOPIC
BILIRUBIN URINE: NEGATIVE
GLUCOSE, UA: NEGATIVE mg/dL
HGB URINE DIPSTICK: NEGATIVE
Ketones, ur: NEGATIVE mg/dL
Nitrite: NEGATIVE
PH: 5.5 (ref 5.0–8.0)
Protein, ur: NEGATIVE mg/dL
SPECIFIC GRAVITY, URINE: 1.015 (ref 1.005–1.030)
UROBILINOGEN UA: 0.2 mg/dL (ref 0.0–1.0)

## 2013-11-06 LAB — URINE MICROSCOPIC-ADD ON

## 2013-11-06 LAB — RPR

## 2013-11-06 LAB — WET PREP, GENITAL
TRICH WET PREP: NONE SEEN
YEAST WET PREP: NONE SEEN

## 2013-11-06 MED ORDER — PENICILLIN G BENZATHINE 1200000 UNIT/2ML IM SUSP
2.4000 10*6.[IU] | Freq: Once | INTRAMUSCULAR | Status: AC
  Administered 2013-11-06: 2.4 10*6.[IU] via INTRAMUSCULAR
  Filled 2013-11-06: qty 4

## 2013-11-06 NOTE — MAU Note (Signed)
Pt presents to MAU with complaints of having reddened area on her vagina, states she was exposed to an STD.

## 2013-11-06 NOTE — MAU Provider Note (Signed)
History     CSN: [id number]  Arrival date and time: 11/06/13 [id number]   First Provider Initiated Contact with Patient 11/06/13 [phone]      Chief Complaint  Patient presents with  . STD eval    HPI  [name] is a 48 y.o. G3P3 who presents today for STD testing and treatment. She states that her boyfriend was told that his syphilis test was positive yesterday. She also has a "red area" on her external genitalia that she is concerned about.   History reviewed. No pertinent past medical history.  Past Surgical History  Procedure Laterality Date  . Abdominal hysterectomy      History reviewed. No pertinent family history.  History  Substance Use Topics  . Smoking status: Current Every Day Smoker -- 0.50 packs/day    Types: Cigarettes  . Smokeless tobacco: Not on file  . Alcohol Use: No    Allergies:  Allergies  Allergen Reactions  . Demerol [Meperidine] Anaphylaxis and Hives    Prescriptions prior to admission  Medication Sig Dispense Refill  . HYDROcodone-acetaminophen (NORCO) 5-325 MG per tablet Take 1-2 tablets by mouth every 4 (four) hours as needed.  10 tablet  0  . meloxicam (MOBIC) 15 MG tablet Take 1 tablet (15 mg total) by mouth daily. Take 1 daily with food.  10 tablet  0    ROS Physical Exam   Blood pressure 128/77, pulse 97, temperature 98.8 F (37.1 C), temperature source Oral, resp. rate 17.  Physical Exam  Nursing note and vitals reviewed. Constitutional: She is oriented to person, place, and time. She appears well-developed and well-nourished. No distress.  Cardiovascular: Normal rate.   Respiratory: Effort normal.  GI: Soft. There is no tenderness.  Genitourinary:   External: no lesion, nothing in the area that the patient points to where she thought it was red.  Vagina: small amount of white discharge Cervix: SA Uterus: SA    Neurological: She is alert and oriented to person, place, and time.  Skin: Skin is warm and dry.   Psychiatric: She has a normal mood and affect.    MAU Course  Procedures  Results for orders placed during the hospital encounter of 11/06/13 (from the past 24 hour(s))  URINALYSIS, ROUTINE W REFLEX MICROSCOPIC     Status: Abnormal   Collection Time    11/06/13  7:41 AM      Result Value Ref Range   Color, Urine YELLOW  YELLOW   APPearance CLEAR  CLEAR   Specific Gravity, Urine 1.015  1.005 - 1.030   pH 5.5  5.0 - 8.0   Glucose, UA NEGATIVE  NEGATIVE mg/dL   Hgb urine dipstick NEGATIVE  NEGATIVE   Bilirubin Urine NEGATIVE  NEGATIVE   Ketones, ur NEGATIVE  NEGATIVE mg/dL   Protein, ur NEGATIVE  NEGATIVE mg/dL   Urobilinogen, UA 0.2  0.0 - 1.0 mg/dL   Nitrite NEGATIVE  NEGATIVE   Leukocytes, UA SMALL (*) NEGATIVE  URINE MICROSCOPIC-ADD ON     Status: Abnormal   Collection Time    11/06/13  7:41 AM      Result Value Ref Range   Squamous Epithelial / LPF FEW (*) RARE   WBC, UA 7-10  <3 WBC/hpf   RBC / HPF 0-2  <3 RBC/hpf   Bacteria, UA MANY (*) RARE  WET PREP, GENITAL     Status: Abnormal   Collection Time    11/06/13  8:40 AM      Result  Value Ref Range   Yeast Wet Prep HPF POC NONE SEEN  NONE SEEN   Trich, Wet Prep NONE SEEN  NONE SEEN   Clue Cells Wet Prep HPF POC FEW (*) NONE SEEN   WBC, Wet Prep HPF POC FEW (*) NONE SEEN     Assessment and Plan   1. STD exposure    RPR, GC/CT pending Treated today with 2.4 Million units of PCN-G IM Return to MAU as needed  Follow-up Information   [facility] [place] (As needed)    Contact information:   [address] [place] [address] [phone]       Tawnya CrookHogan, Heather Donovan 11/06/2013, 8:44 AM

## 2013-11-06 NOTE — Discharge Instructions (Signed)
Syphilis Syphilis is an infectious disease. It can cause serious complications if left untreated.  CAUSES  Syphilis is caused by a type of bacteria called Treponema pallidum. It is most commonly spread through sexual contact. Syphilis may also spread to a fetus through the blood of the mother.  SIGNS AND SYMPTOMS Symptoms vary depending on the stage of the disease. Some symptoms may disappear without treatment. However, this does not mean that the infection is gone. One form of syphilis (called latent syphilis) has no symptoms.  Primary Syphilis  Painless sores (chancres) in and around the genital organs and mouth.  Swollen lymph nodes near the sores. Secondary Syphilis  A rash or sores over any portion of the body, including the palms of the hands and soles of the feet.  Fever.  Headache.  Sore throat.  Swollen lymph nodes.  New sores in the mouth or on the genitals.  Feeling generally ill.  Having pain in the joints. Tertiary Syphilis The third stage of syphilis involves severe damage to different organs in the body, such as the brain, spinal cord, and heart. Signs and symptoms may include:   Dementia.  Personality and mood changes.  Difficulty walking.  Heart failure.  Fainting.  Enlargement (aneurysm) of the aorta.  Tumors of the skin, bones, or liver.  Muscle weakness.  Sudden "lightning" pains, numbness, or tingling.  Problems with coordination.  Vision changes. DIAGNOSIS   A physical exam will be done.  Blood tests will be done to confirm the diagnosis.  If the disease is in the first or second stages, a fluid (drainage) sample from a sore or rash may be examined under a microscope to detect the disease-causing bacteria.  Fluid around the spine may need to be examined to detect brain damage or inflammation of the brain lining (meningitis).  If the disease is in the third stage, X-rays, CT scans, MRIs, echocardiograms, ultrasounds, or cardiac  catheterization may also be done to detect disease of the heart, aorta, or brain. TREATMENT  Syphilis can be cured with antibiotic medicine if a diagnosis is made early. During the first day of treatment, you may experience fever, chills, headache, nausea, or aching all over your body. This is a normal reaction to the antibiotics.  HOME CARE INSTRUCTIONS   Take antibiotics as directed by your health care provider. Make sure you finish them, even if you start to feel better. Incomplete treatment will put you at risk for continued infection and could be life-threatening.  Only take over-the-counter or prescription medicines for pain, discomfort, or fever as directed by your health care provider.  Do not have sexual intercourse until your treatment is completed or as directed by your health care provider.  Inform your recent sexual partners that you were diagnosed with syphilis. They need to seek care and treatment, even if they have no symptoms. It is necessary that all your sexual partners be tested for infection and treated if they have the disease.  Follow up with your health care provider as directed.  If your test results are not ready during your visit, make an appointment with your health care provider to find out the results. Do not assume everything is normal if you have not heard from your health care provider or the medical facility. It is important for you to follow up on all of your test results. SEEK MEDICAL CARE IF:  You continue to have any of the following 24 hours after beginning treatment:  Fever.  Chills.  Headache.  Nausea.  Aching all over your body.  You have symptoms of an allergic reaction to medicine, such as:  Chills.  A headache.  Lightheadedness.  A new rash (especially hives).  Difficulty breathing. SEEK IMMEDIATE MEDICAL CARE IF:   You have a fever or persistent symptoms for more than 2-3 days.  You have a fever and your symptoms suddenly get  worse. Document Released: 02/23/2013 Document Reviewed: 02/23/2013 [facility] Patient Information 2015 [place], [place]. This information is not intended to replace advice given to you by your health care provider. Make sure you discuss any questions you have with your health care provider.

## 2013-11-07 LAB — GC/CHLAMYDIA PROBE AMP
CT PROBE, AMP APTIMA: NEGATIVE
GC PROBE AMP APTIMA: NEGATIVE

## 2014-03-08 ENCOUNTER — Ambulatory Visit (INDEPENDENT_AMBULATORY_CARE_PROVIDER_SITE_OTHER): Payer: PRIVATE HEALTH INSURANCE | Admitting: Physician Assistant

## 2014-03-08 VITALS — BP 124/70 | HR 93 | Temp 98.3°F | Resp 18 | Ht 65.0 in | Wt 225.6 lb

## 2014-03-08 DIAGNOSIS — H01113 Allergic dermatitis of right eye, unspecified eyelid: Secondary | ICD-10-CM

## 2014-03-08 MED ORDER — CETIRIZINE HCL 10 MG PO TABS: 10.0000 mg | ORAL_TABLET | Freq: Every day | ORAL | Status: DC

## 2014-03-08 MED ORDER — PREDNISONE 20 MG PO TABS: ORAL_TABLET | ORAL | Status: DC

## 2014-03-08 NOTE — Progress Notes (Signed)
   Subjective:    Patient ID: Debra Welch, female    DOB: 1965-07-22, 48 y.o.   MRN: [id number]  Eye Problem    Debra Welch is a 48 y.o. female with pmh of seasonal allergies presenting for right eye swelling and intense pruritis of said eye. Patient initially had stye, treated with a Z-pack by a doctor she works. On third day of using Z-pack, stye popped and drained, started to feel better. Yesterday night, patient's eye started itching intensely near bridge of her nose causing her to scratch. Patient went to sleep and this morning eye was swollen, itching, blurry vision and tearing. Denies fevers, n/v, eye pain, limited eye movement, matted eyes, headaches, ear pain, throat pain, rhinorrhea, sinus pain. Denies any other aggravating or relieving factors, no other questions or concerns.  Patient would also like to designate [facility] as her primary care. Wants to know if she schedule a physical exam.  Medications: None  Allergies  Allergen Reactions  . Demerol [Meperidine] Anaphylaxis and Hives  . Loratadine     dehydration    Past Medical History  Diagnosis Date  . Allergy     seasonal     Past Surgical History  Procedure Laterality Date  . Abdominal hysterectomy      Review of Systems As in subjective.     Objective:   Physical Exam  Constitutional: She appears well-developed and well-nourished. No distress.  BP 124/70  Pulse 93  Temp(Src) 98.3 F (36.8 C) (Oral)  Resp 18  Ht 5\' 5"  (1.651 m)  Wt 225 lb 9.6 oz (102.331 kg)  BMI 37.54 kg/m2  SpO2 98%   HENT:  Head: Normocephalic and atraumatic.  Right Ear: External ear normal.  Left Ear: External ear normal.  Nose: Nose normal.  Mouth/Throat: Oropharynx is clear and moist. No oropharyngeal exudate.  Eyes: Pupils are equal, round, and reactive to light. Right eye exhibits discharge (clear discharge/tears). Right eye exhibits no chemosis, no exudate and no hordeolum. No foreign body present in the right eye. Left  eye exhibits no discharge. Right conjunctiva is not injected. Right conjunctiva has no hemorrhage. No scleral icterus. Right eye exhibits normal extraocular motion and no nystagmus.  Right upper eyelid with mild-moderate edema, slight erythema of right lower eyelid, non-blanching. No tenderness.  Skin: She is not diaphoretic.      Assessment & Plan:   1. Contact dermatitis of eyelid, right Likely due to scratching, unlikely to be infectious etiology, Rx prednisone and cetirizine, return to clinic if symptoms worsen, fail to resolve or as needed - cetirizine (ZYRTEC) 10 MG tablet; Take 1 tablet (10 mg total) by mouth daily.  Dispense: 30 tablet; Refill: 11 - predniSONE (DELTASONE) 20 MG tablet; Take 3 PO QAM x3days, 2 PO QAM x3days, 1 PO QAM x3days  Dispense: 18 tablet; Refill: 0  2. Will request a scheduled appointment for an annual physical exam at appointment center.  Wallis BambergMario Calina Patrie, PA-C [facility] [phone] 03/08/2014 3:42 PM

## 2014-03-08 NOTE — Progress Notes (Signed)
I have discussed this patient with Mr. Mani, PA-C and agree.  

## 2014-04-11 ENCOUNTER — Ambulatory Visit: Payer: Self-pay | Admitting: Family Medicine

## 2014-06-06 ENCOUNTER — Ambulatory Visit (INDEPENDENT_AMBULATORY_CARE_PROVIDER_SITE_OTHER): Payer: PRIVATE HEALTH INSURANCE | Admitting: Family Medicine

## 2014-06-06 ENCOUNTER — Encounter: Payer: Self-pay | Admitting: Family Medicine

## 2014-06-06 VITALS — BP 132/78 | HR 115 | Temp 98.8°F | Resp 16 | Ht 64.5 in | Wt 232.6 lb

## 2014-06-06 DIAGNOSIS — R000 Tachycardia, unspecified: Secondary | ICD-10-CM

## 2014-06-06 DIAGNOSIS — G43809 Other migraine, not intractable, without status migrainosus: Secondary | ICD-10-CM | POA: Insufficient documentation

## 2014-06-06 DIAGNOSIS — Z720 Tobacco use: Secondary | ICD-10-CM

## 2014-06-06 DIAGNOSIS — F17200 Nicotine dependence, unspecified, uncomplicated: Secondary | ICD-10-CM | POA: Insufficient documentation

## 2014-06-06 DIAGNOSIS — G479 Sleep disorder, unspecified: Secondary | ICD-10-CM

## 2014-06-06 DIAGNOSIS — I471 Supraventricular tachycardia: Secondary | ICD-10-CM

## 2014-06-06 DIAGNOSIS — E669 Obesity, unspecified: Secondary | ICD-10-CM

## 2014-06-06 LAB — CBC WITH DIFFERENTIAL/PLATELET
BASOS ABS: 0.1 10*3/uL (ref 0.0–0.1)
BASOS PCT: 1 % (ref 0–1)
EOS ABS: 0.2 10*3/uL (ref 0.0–0.7)
EOS PCT: 4 % (ref 0–5)
HEMATOCRIT: 39.6 % (ref 36.0–46.0)
Hemoglobin: 13.5 g/dL (ref 12.0–15.0)
LYMPHS ABS: 2.1 10*3/uL (ref 0.7–4.0)
Lymphocytes Relative: 35 % (ref 12–46)
MCH: 29.3 pg (ref 26.0–34.0)
MCHC: 34.1 g/dL (ref 30.0–36.0)
MCV: 85.9 fL (ref 78.0–100.0)
MPV: 10 fL (ref 8.6–12.4)
Monocytes Absolute: 0.4 10*3/uL (ref 0.1–1.0)
Monocytes Relative: 7 % (ref 3–12)
NEUTROS ABS: 3.1 10*3/uL (ref 1.7–7.7)
NEUTROS PCT: 53 % (ref 43–77)
PLATELETS: 223 10*3/uL (ref 150–400)
RBC: 4.61 MIL/uL (ref 3.87–5.11)
RDW: 14.4 % (ref 11.5–15.5)
WBC: 5.9 10*3/uL (ref 4.0–10.5)

## 2014-06-06 LAB — COMPLETE METABOLIC PANEL WITH GFR
ALBUMIN: 4.2 g/dL (ref 3.5–5.2)
ALK PHOS: 53 U/L (ref 39–117)
ALT: 18 U/L (ref 0–35)
AST: 13 U/L (ref 0–37)
BILIRUBIN TOTAL: 0.4 mg/dL (ref 0.2–1.2)
BUN: 11 mg/dL (ref 6–23)
CHLORIDE: 104 meq/L (ref 96–112)
CO2: 25 mEq/L (ref 19–32)
CREATININE: 0.96 mg/dL (ref 0.50–1.10)
Calcium: 9.2 mg/dL (ref 8.4–10.5)
GFR, EST AFRICAN AMERICAN: 81 mL/min
GFR, Est Non African American: 71 mL/min
GLUCOSE: 101 mg/dL — AB (ref 70–99)
POTASSIUM: 4.1 meq/L (ref 3.5–5.3)
SODIUM: 137 meq/L (ref 135–145)
Total Protein: 6.9 g/dL (ref 6.0–8.3)

## 2014-06-06 LAB — LIPID PANEL
CHOL/HDL RATIO: 5.4 ratio
CHOLESTEROL: 185 mg/dL (ref 0–200)
HDL: 34 mg/dL — AB (ref >39)
TRIGLYCERIDES: 604 mg/dL — AB (ref <150)

## 2014-06-06 LAB — THYROID PANEL WITH TSH
FREE THYROXINE INDEX: 2.2 (ref 1.4–3.8)
T3 Uptake: 28 % (ref 22–35)
T4 TOTAL: 8 ug/dL (ref 4.5–12.0)
TSH: 1.858 u[IU]/mL (ref 0.350–4.500)

## 2014-06-06 LAB — POCT GLYCOSYLATED HEMOGLOBIN (HGB A1C): HEMOGLOBIN A1C: 5.8

## 2014-06-06 MED ORDER — NORTRIPTYLINE HCL 25 MG PO CAPS: 25.0000 mg | ORAL_CAPSULE | Freq: Every day | ORAL | Status: DC

## 2014-06-06 NOTE — Progress Notes (Signed)
Subjective:    Patient ID: Debra Welch, female    DOB: 01/05/1966, 49 y.o.   MRN: [id number]  HPI  This 49 y.o. Female is here to establish care at [facility]. She has no PCP/PCH for > 5 years. Last contact w/ medical professional was immediately after her TAH for fibroids/menorrhagia in 2006 (Dr. Audie BoxFontaine).   Pt has multiple complaints, primarily concern abut migraine HA disorder w/ onset < 2 years ago. Temporal HA accompanied by blurred vision and nausea. Eyecare with professional in Dec 2015 >> prescription; pt wears contacts. Relief w/ Excedrin Migraine 2-3 tabs plus caffeine (coffee or Pepsi). Resting in low lights and quiet room usually relieves HA. Pt requests medication to prevent HA; when she develops a HA at her 2nd job, she has to clock out and go home. She cannot afford this loss of work and pay.    Pt also has sleep disorder and has been told she snores. Sleep onset is normal but she awakens at 2 AM and cannot get back to sleep. Trazodone trial in past but med not well tolerated.  C/o night sweats. Pt also has reflux, treated w/ Rolaids; this has increased in severity over the last month. Also having daytime symptoms.  Pt acknowledges weight problem; she exercises daily for 45 minutes on a treadmill x 6 months and has reduced caloric intake. She consumes smoothies that have Chia seeds and she tries to snack healthy. Pt is a daily smoker and is ready to quit. No success with Nicorette gum. Reports a hx of bronchitis. Also has seasonal allergies.  HCM: MMG- Needs to have initial screening.           IMM- Tdap current.    History   Social History  . Marital Status: Single    Spouse Name: N/A    Number of Children: N/A  . Years of Education: N/A   Occupational History  . Not on file.   Social History Main Topics  . Smoking status: Current Every Day Smoker -- 0.50 packs/day for 24 years    Types: Cigarettes  . Smokeless tobacco: Not on file  . Alcohol Use: No  . Drug  Use: No  . Sexual Activity: Yes   Other Topics Concern  . Not on file   Social History Narrative   Pt works as a Radio producermental intake coordinator; she also has another part-time job. She recently withdrew from [employer] program w/ [employer].    Family History  Problem Relation Age of Onset  . Lupus Mother   . Hypertension Father     Deceased at age 10382  . Asthma Mother   . Asthma Sister   . Asthma Maternal Grandmother   . Migraines Daughter     Review of Systems  Constitutional: Negative for fever, diaphoresis, appetite change and fatigue.  Eyes: Positive for visual disturbance.  Respiratory: Negative for cough, chest tightness and shortness of breath.   Neurological: Positive for headaches.  Psychiatric/Behavioral: Positive for sleep disturbance.  Otherwise, As per HPI.     Objective:   Physical Exam  Constitutional: She is oriented to person, place, and time. She appears well-developed and well-nourished. No distress.  Blood pressure 132/78, pulse 115, temperature 98.8 F (37.1 C), temperature source Oral, resp. rate 16, height 5' 4.5" (1.638 m), weight 232 lb 9.6 oz (105.507 kg), SpO2 98 %.   HENT:  Head: Normocephalic and atraumatic.  Right Ear: External ear normal.  Left Ear: External ear normal.  Nose: Nose normal.  Mouth/Throat: Oropharynx is clear and moist.  Eyes: Conjunctivae and EOM are normal. Pupils are equal, round, and reactive to light. No scleral icterus.  Neck: Normal range of motion. Neck supple. No thyromegaly present.  Cardiovascular: Normal rate, regular rhythm, normal heart sounds and intact distal pulses.  Exam reveals no gallop and no friction rub.   No murmur heard. Pulmonary/Chest: Effort normal and breath sounds normal. No respiratory distress. She has no wheezes.  Abdominal: Soft. Bowel sounds are normal. She exhibits distension. She exhibits no mass. There is no tenderness. There is no guarding.  Intra-abdominal masses or HSM difficulty to  assess due to obesity.  Musculoskeletal: Normal range of motion. She exhibits no edema or tenderness.  Lymphadenopathy:    She has no cervical adenopathy.  Neurological: She is alert and oriented to person, place, and time. No cranial nerve deficit. She exhibits normal muscle tone. Coordination normal.  DTRs difficult to illicit.  Skin: Skin is warm and dry. No rash noted. She is not diaphoretic. No erythema.  Psychiatric: She has a normal mood and affect. Her behavior is normal. Judgment and thought content normal.  Nursing note and vitals reviewed.   Results for orders placed or performed in visit on 06/06/14  POCT glycosylated hemoglobin (Hb A1C)  Result Value Ref Range   Hemoglobin A1C 5.8       Assessment & Plan:  Headache, variant migraine - Advised that HA disorder aggravated by tobacco use, stress and lack of sound sleep. Trial Nortriptyline 25 mg 1 cap at bedtime for migraine prophylaxis. Plan: Thyroid Panel With TSH, COMPLETE METABOLIC PANEL WITH GFR  Tobacco use disorder - Information re: cessation given to pt. Nortriptyline may help reduce her desire to smoke. She has to commit to cessation and be ready to quit. I have suggested she try smoking one less cigarette daily or every other day.   Sinus tachycardia - Plan: Thyroid Panel With TSH, Lipid panel, COMPLETE METABOLIC PANEL WITH GFR, CBC with Differential  Sleep disturbance- Briefly discussed Sleep Study with pt; she is hesitant because she does not think she could sleep using CPAP device.  Obesity - Continue nutritional modifications and daily exercises. Advised her that she has impaired glucose tolerance w/ slightly elevated A1c. Advised about Mediterranean Diet. Plan: Thyroid Panel With TSH, Lipid panel, COMPLETE METABOLIC PANEL WITH GFR, CBC with Differential, POCT glycosylated hemoglobin (Hb A1C)   Meds ordered this encounter  Medications  . nortriptyline (PAMELOR) 25 MG capsule    Sig: Take 1 capsule (25 mg  total) by mouth at bedtime.    Dispense:  30 capsule    Refill:  3

## 2014-06-06 NOTE — Patient Instructions (Addendum)
You have impaired glucose tolerance. Dietary changes for improved health and weight loss are imperative!  Below is information about a healthy lifestyle that can reduce your risk of developing Diabetes.      Mediterranean Diet  Why follow it? Research shows. . Those who follow the Mediterranean diet have a reduced risk of heart disease  . The diet is associated with a reduced incidence of Parkinson's and Alzheimer's diseases . People following the diet may have longer life expectancies and lower rates of chronic diseases  . The Dietary Guidelines for Americans recommends the Mediterranean diet as an eating plan to promote health and prevent disease  What Is the Mediterranean Diet?  . Healthy eating plan based on typical foods and recipes of Mediterranean-style cooking . The diet is primarily a plant based diet; these foods should make up a majority of meals   Starches - Plant based foods should make up a majority of meals - They are an important sources of vitamins, minerals, energy, antioxidants, and fiber - Choose whole grains, foods high in fiber and minimally processed items  - Typical grain sources include wheat, oats, barley, corn, brown rice, bulgar, farro, millet, polenta, couscous  - Various types of beans include chickpeas, lentils, fava beans, black beans, white beans   Fruits  Veggies - Large quantities of antioxidant rich fruits & veggies; 6 or more servings  - Vegetables can be eaten raw or lightly drizzled with oil and cooked  - Vegetables common to the traditional Mediterranean Diet include: artichokes, arugula, beets, broccoli, brussel sprouts, cabbage, carrots, celery, collard greens, cucumbers, eggplant, kale, leeks, lemons, lettuce, mushrooms, okra, onions, peas, peppers, potatoes, pumpkin, radishes, rutabaga, shallots, spinach, sweet potatoes, turnips, zucchini - Fruits common to the Mediterranean Diet include: apples, apricots, avocados, cherries, clementines, dates,  figs, grapefruits, grapes, melons, nectarines, oranges, peaches, pears, pomegranates, strawberries, tangerines  Fats - Replace butter and margarine with healthy oils, such as olive oil, canola oil, and tahini  - Limit nuts to no more than a handful a day  - Nuts include walnuts, almonds, pecans, pistachios, pine nuts  - Limit or avoid candied, honey roasted or heavily salted nuts - Olives are central to the [employer] - can be eaten whole or used in a variety of dishes   Meats Protein - Limiting red meat: no more than a few times a month - When eating red meat: choose lean cuts and keep the portion to the size of deck of cards - Eggs: approx. 0 to 4 times a week  - Fish and lean poultry: at least 2 a week  - Healthy protein sources include, chicken, [place], lean beef, lamb - Increase intake of seafood such as tuna, salmon, trout, mackerel, shrimp, scallops - Avoid or limit high fat processed meats such as sausage and bacon  Dairy - Include moderate amounts of low fat dairy products  - Focus on healthy dairy such as fat free yogurt, skim milk, low or reduced fat cheese - Limit dairy products higher in fat such as whole or 2% milk, cheese, ice cream  Alcohol - Moderate amounts of red wine is ok  - No more than 5 oz daily for women (all ages) and men older than age 71  - No more than 10 oz of wine daily for men younger than 70  Other - Limit sweets and other desserts  - Use herbs and spices instead of salt to flavor foods  - Herbs and spices common to the traditional  Mediterranean Diet include: basil, bay leaves, chives, cloves, cumin, fennel, garlic, lavender, marjoram, mint, oregano, parsley, pepper, rosemary, sage, savory, sumac, tarragon, thyme   It's not just a diet, it's a lifestyle:  . The Mediterranean diet includes lifestyle factors typical of those in the region  . Foods, drinks and meals are best eaten with others and savored . Daily physical activity is important for  overall good health . This could be strenuous exercise like running and aerobics . This could also be more leisurely activities such as walking, housework, yard-work, or taking the stairs . Moderation is the key; a balanced and healthy diet accommodates most foods and drinks . Consider portion sizes and frequency of consumption of certain foods   Meal Ideas & Options:  . Breakfast:  o Whole wheat toast or whole wheat English muffins with peanut butter & hard boiled egg o Steel cut oats topped with apples & cinnamon and skim milk  o Fresh fruit: banana, strawberries, melon, berries, peaches  o Smoothies: strawberries, bananas, greek yogurt, peanut butter o Low fat greek yogurt with blueberries and granola  o Egg white omelet with spinach and mushrooms o Breakfast couscous: whole wheat couscous, apricots, skim milk, cranberries  . Sandwiches:  o Hummus and grilled vegetables (peppers, zucchini, squash) on whole wheat bread   o Grilled chicken on whole wheat pita with lettuce, tomatoes, cucumbers or tzatziki  o Tuna salad on whole wheat bread: tuna salad made with greek yogurt, olives, red peppers, capers, green onions o Garlic rosemary lamb pita: lamb sauted with garlic, rosemary, salt & pepper; add lettuce, cucumber, greek yogurt to pita - flavor with lemon juice and black pepper  . Seafood:  o Mediterranean grilled salmon, seasoned with garlic, basil, parsley, lemon juice and black pepper o Shrimp, lemon, and spinach whole-grain pasta salad made with low fat greek yogurt  o Seared scallops with lemon orzo  o Seared tuna steaks seasoned salt, pepper, coriander topped with tomato mixture of olives, tomatoes, olive oil, minced garlic, parsley, green onions and cappers  . Meats:  o Herbed greek chicken salad with kalamata olives, cucumber, feta  o Red bell peppers stuffed with spinach, bulgur, lean ground beef (or lentils) & topped with feta   o Kebabs: skewers of chicken, tomatoes, onions,  zucchini, squash  o [place] burgers: made with red onions, mint, dill, lemon juice, feta cheese topped with roasted red peppers . Vegetarian o Cucumber salad: cucumbers, artichoke hearts, celery, red onion, feta cheese, tossed in olive oil & lemon juice  o Hummus and whole grain pita points with a greek salad (lettuce, tomato, feta, olives, cucumbers, red onion) o Lentil soup with celery, carrots made with vegetable broth, garlic, salt and pepper  o Tabouli salad: parsley, bulgur, mint, scallions, cucumbers, tomato, radishes, lemon juice, olive oil, salt and pepper. o      Smoking Cessation, Tips for Success If you are ready to quit smoking, congratulations! You have chosen to help yourself be healthier. Cigarettes bring nicotine, tar, carbon monoxide, and other irritants into your body. Your lungs, heart, and blood vessels will be able to work better without these poisons. There are many different ways to quit smoking. Nicotine gum, nicotine patches, a nicotine inhaler, or nicotine nasal spray can help with physical craving. Hypnosis, support groups, and medicines help break the habit of smoking. WHAT THINGS CAN I DO TO MAKE QUITTING EASIER?  Here are some tips to help you quit for good:  Pick a date when you  will quit smoking completely. Tell all of your friends and family about your plan to quit on that date.  Do not try to slowly cut down on the number of cigarettes you are smoking. Pick a quit date and quit smoking completely starting on that day.  Throw away all cigarettes.   Clean and remove all ashtrays from your home, work, and car.  On a card, write down your reasons for quitting. Carry the card with you and read it when you get the urge to smoke.  Cleanse your body of nicotine. Drink enough water and fluids to keep your urine clear or pale yellow. Do this after quitting to flush the nicotine from your body.  Learn to predict your moods. Do not let a bad situation be your  excuse to have a cigarette. Some situations in your life might tempt you into wanting a cigarette.  Never have "just one" cigarette. It leads to wanting another and another. Remind yourself of your decision to quit.  Change habits associated with smoking. If you smoked while driving or when feeling stressed, try other activities to replace smoking. Stand up when drinking your coffee. Brush your teeth after eating. Sit in a different chair when you read the paper. Avoid alcohol while trying to quit, and try to drink fewer caffeinated beverages. Alcohol and caffeine may urge you to smoke.  Avoid foods and drinks that can trigger a desire to smoke, such as sugary or spicy foods and alcohol.  Ask people who smoke not to smoke around you.  Have something planned to do right after eating or having a cup of coffee. For example, plan to take a walk or exercise.  Try a relaxation exercise to calm you down and decrease your stress. Remember, you may be tense and nervous for the first 2 weeks after you quit, but this will pass.  Find new activities to keep your hands busy. Play with a pen, coin, or rubber band. Doodle or draw things on paper.  Brush your teeth right after eating. This will help cut down on the craving for the taste of tobacco after meals. You can also try mouthwash.   Use oral substitutes in place of cigarettes. Try using lemon drops, carrots, cinnamon sticks, or chewing gum. Keep them handy so they are available when you have the urge to smoke.  When you have the urge to smoke, try deep breathing.  Designate your home as a nonsmoking area.  If you are a heavy smoker, ask your health care provider about a prescription for nicotine chewing gum. It can ease your withdrawal from nicotine.  Reward yourself. Set aside the cigarette money you save and buy yourself something nice.  Look for support from others. Join a support group or smoking cessation program. Ask someone at home or at  work to help you with your plan to quit smoking.  Always ask yourself, "Do I need this cigarette or is this just a reflex?" Tell yourself, "Today, I choose not to smoke," or "I do not want to smoke." You are reminding yourself of your decision to quit.  Do not replace cigarette smoking with electronic cigarettes (commonly called e-cigarettes). The safety of e-cigarettes is unknown, and some may contain harmful chemicals.  If you relapse, do not give up! Plan ahead and think about what you will do the next time you get the urge to smoke. HOW WILL I FEEL WHEN I QUIT SMOKING? You may have symptoms of withdrawal because your  body is used to nicotine (the addictive substance in cigarettes). You may crave cigarettes, be irritable, feel very hungry, cough often, get headaches, or have difficulty concentrating. The withdrawal symptoms are only temporary. They are strongest when you first quit but will go away within 10-14 days. When withdrawal symptoms occur, stay in control. Think about your reasons for quitting. Remind yourself that these are signs that your body is healing and getting used to being without cigarettes. Remember that withdrawal symptoms are easier to treat than the major diseases that smoking can cause.  Even after the withdrawal is over, expect periodic urges to smoke. However, these cravings are generally short lived and will go away whether you smoke or not. Do not smoke! WHAT RESOURCES ARE AVAILABLE TO HELP ME QUIT SMOKING? Your health care provider can direct you to community resources or hospitals for support, which may include:  Group support.  Education.  Hypnosis.  Therapy. Document Released: 02/01/2004 Document Revised: 09/19/2013 Document Reviewed: 10/21/2012 [facility] Patient Information 2015 [place], [place]. This information is not intended to replace advice given to you by your health care provider. Make sure you discuss any questions you have with your health care  provider.

## 2014-07-05 ENCOUNTER — Encounter: Payer: Self-pay | Admitting: Family Medicine

## 2014-07-05 ENCOUNTER — Ambulatory Visit (INDEPENDENT_AMBULATORY_CARE_PROVIDER_SITE_OTHER): Payer: PRIVATE HEALTH INSURANCE | Admitting: Family Medicine

## 2014-07-05 VITALS — BP 133/84 | HR 120 | Temp 98.7°F | Resp 16 | Ht 64.5 in | Wt 229.0 lb

## 2014-07-05 DIAGNOSIS — G479 Sleep disorder, unspecified: Secondary | ICD-10-CM

## 2014-07-05 DIAGNOSIS — R000 Tachycardia, unspecified: Secondary | ICD-10-CM

## 2014-07-05 DIAGNOSIS — E781 Pure hyperglyceridemia: Secondary | ICD-10-CM

## 2014-07-05 DIAGNOSIS — G43809 Other migraine, not intractable, without status migrainosus: Secondary | ICD-10-CM

## 2014-07-05 MED ORDER — PROPRANOLOL HCL 40 MG PO TABS: 40.0000 mg | ORAL_TABLET | Freq: Two times a day (BID) | ORAL | Status: DC

## 2014-07-05 MED ORDER — NORTRIPTYLINE HCL 25 MG PO CAPS: ORAL_CAPSULE | ORAL | Status: DC

## 2014-07-05 NOTE — Progress Notes (Signed)
Subjective:    Patient ID: Debra Welch, female    DOB: 1966-01-14, 49 y.o.   MRN: [id number]  HPI  This 49 y.o. Female has migraine HA variant disorder; she reports less HA but did have one 5 days ago while at work. She had blurred vision w/o nausea or dizziness. She dimmed the lights, took 3 Aleve and drank a Pepsi. HA lasted ~ 3-4 hours; she was HA - free by the end of her work day.   Sleep disturbance continues; nortriptyline 25 mg at bedtime does not help pt sleep. Medication taken at 8 PM, in the bed by 9:30 PM and still awake at 11:00 PM. She may fall asleep but is awake at 2 AM. 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   Tachycardia persists; pt not aware of palpitations. She denies CP or tightness, SOB, cough and edema. She denies excessive caffeine intake. Tobacco use - 5 cigs/daily. Does not smoke in the house. Plans to quit before next visit.   Review of labs shows very high triglycerides. This was not fasting lab work. Discussed foods that can contribute to this problems; she endorses intake of processed foods.  Patient Active Problem List   Diagnosis Date Noted  . Headache, variant migraine 06/06/2014  . Tobacco use disorder 06/06/2014  . Sleep disturbance 06/06/2014  . Obesity 06/06/2014    Prior to Admission medications   Medication Sig Start Date End Date Taking? Authorizing Provider  calcium carbonate (TUMS - DOSED IN MG ELEMENTAL CALCIUM) 500 MG chewable tablet Chew 1 tablet by mouth daily as needed for indigestion or heartburn.   Yes Historical Provider, MD  cetirizine (ZYRTEC) 10 MG tablet Take 1 tablet (10 mg total) by mouth daily. 03/08/14  Yes Jaynee Eagles, PA-C  nortriptyline (PAMELOR) 25 MG capsule Take 1 capsule at bedtime for sleep.   Yes Barton Fanny, MD    Past Surgical History  Procedure Laterality Date  . Abdominal hysterectomy       Soc and FAM Hx reviewed.   Review of Systems  Constitutional: Positive for fatigue. Negative for diaphoresis and unexpected weight change.  Eyes: Positive for visual disturbance.  Respiratory: Negative.   Cardiovascular: Negative.   Endocrine: Negative.   Musculoskeletal: Negative.   Neurological: Positive for headaches. Negative for dizziness, syncope, speech difficulty, weakness and numbness.  Psychiatric/Behavioral: Positive for sleep disturbance.       Objective:   Physical Exam  Constitutional: She is oriented to person, place, and time. She appears well-developed and well-nourished. No distress.  HENT:  Head: Normocephalic and atraumatic.  Right Ear: External ear normal.  Left Ear: External ear normal.  Nose: Nose normal.  Mouth/Throat: Oropharynx is clear and moist.  Eyes: EOM are normal. Pupils are equal, round, and reactive to light. No scleral icterus.  Conjunctivae injected.  Neck: Normal range of motion. Neck supple.  Cardiovascular: Normal rate and regular rhythm.   Pulmonary/Chest: No respiratory distress.  Musculoskeletal: Normal range of motion. She exhibits no edema.  Neurological: She is alert and oriented to person, place, and time. No cranial nerve deficit. She exhibits normal muscle tone. Coordination normal.  Skin: Skin is warm and dry. She is not diaphoretic.  Psychiatric: She has a normal mood and affect. Her behavior is normal. Judgment and thought content normal.  Nursing note and vitals reviewed.   Results for orders placed or performed in visit  on 06/06/14  Thyroid Panel With TSH  Result Value Ref Range   T4, Total 8.0 4.5 - 12.0 ug/dL   T3 Uptake 28 22 - 35 %   Free Thyroxine Index 2.2 1.4 - 3.8   TSH 1.858 0.350 - 4.500 uIU/mL  Lipid panel  Result Value Ref Range   Cholesterol 185 0 - 200 mg/dL   Triglycerides 604 (H) <150 mg/dL   HDL 34 (L) >39 mg/dL   Total CHOL/HDL Ratio 5.4 Ratio   VLDL NOT CALC 0 - 40 mg/dL   LDL Cholesterol  NOT CALC 0 - 99 mg/dL  COMPLETE METABOLIC PANEL WITH GFR  Result Value Ref Range   Sodium 137 135 - 145 mEq/L   Potassium 4.1 3.5 - 5.3 mEq/L   Chloride 104 96 - 112 mEq/L   CO2 25 19 - 32 mEq/L   Glucose, Bld 101 (H) 70 - 99 mg/dL   BUN 11 6 - 23 mg/dL   Creat 0.96 0.50 - 1.10 mg/dL   Total Bilirubin 0.4 0.2 - 1.2 mg/dL   Alkaline Phosphatase 53 39 - 117 U/L   AST 13 0 - 37 U/L   ALT 18 0 - 35 U/L   Total Protein 6.9 6.0 - 8.3 g/dL   Albumin 4.2 3.5 - 5.2 g/dL   Calcium 9.2 8.4 - 10.5 mg/dL   GFR, Est African American 81 mL/min   GFR, Est Non African American 71 mL/min  CBC with Differential  Result Value Ref Range   WBC 5.9 4.0 - 10.5 K/uL   RBC 4.61 3.87 - 5.11 MIL/uL   Hemoglobin 13.5 12.0 - 15.0 g/dL   HCT 39.6 36.0 - 46.0 %   MCV 85.9 78.0 - 100.0 fL   MCH 29.3 26.0 - 34.0 pg   MCHC 34.1 30.0 - 36.0 g/dL   RDW 14.4 11.5 - 15.5 %   Platelets 223 150 - 400 K/uL   MPV 10.0 8.6 - 12.4 fL   Neutrophils Relative % 53 43 - 77 %   Neutro Abs 3.1 1.7 - 7.7 K/uL   Lymphocytes Relative 35 12 - 46 %   Lymphs Abs 2.1 0.7 - 4.0 K/uL   Monocytes Relative 7 3 - 12 %   Monocytes Absolute 0.4 0.1 - 1.0 K/uL   Eosinophils Relative 4 0 - 5 %   Eosinophils Absolute 0.2 0.0 - 0.7 K/uL   Basophils Relative 1 0 - 1 %   Basophils Absolute 0.1 0.0 - 0.1 K/uL   Smear Review Criteria for review not met   POCT glycosylated hemoglobin (Hb A1C)  Result Value Ref Range   Hemoglobin A1C 5.8        Assessment & Plan:  Headache, variant migraine- Start Propranolol 40 mg 1 tablet twice a day for migraine prophylaxis and to help reduce heart rate.  Sleep disturbance- Increase nortriptyline to two 25 mg tablets at bedtime. Pt has a lot of stressors making relaxation at bedtime difficult.  Tachycardia- Propranolol should help reduce heart rate.   Hypertriglyceridemia- Nutrition modifications advised. Will recheck in 3 months. Pt reminded that she has impaired glucose tolerance; if A1c  increases, medication to treat Pre-Diabetes will be considered.  Meds ordered this encounter  Medications  . nortriptyline (PAMELOR) 25 MG capsule    Sig: Take 2 capsules at bedtime for sleep.    Dispense:  60 capsule    Refill:  3  . propranolol (INDERAL) 40 MG tablet    Sig: Take 1 tablet (  40 mg total) by mouth 2 (two) times daily.    Dispense:  60 tablet    Refill:  3

## 2014-07-31 ENCOUNTER — Ambulatory Visit (INDEPENDENT_AMBULATORY_CARE_PROVIDER_SITE_OTHER): Payer: PRIVATE HEALTH INSURANCE

## 2014-07-31 ENCOUNTER — Telehealth: Payer: Self-pay

## 2014-07-31 ENCOUNTER — Ambulatory Visit (INDEPENDENT_AMBULATORY_CARE_PROVIDER_SITE_OTHER): Payer: PRIVATE HEALTH INSURANCE | Admitting: Physician Assistant

## 2014-07-31 VITALS — BP 120/80 | HR 90 | Temp 98.0°F | Resp 16 | Ht 64.25 in | Wt 229.2 lb

## 2014-07-31 DIAGNOSIS — M25512 Pain in left shoulder: Secondary | ICD-10-CM

## 2014-07-31 DIAGNOSIS — R000 Tachycardia, unspecified: Secondary | ICD-10-CM

## 2014-07-31 DIAGNOSIS — R002 Palpitations: Secondary | ICD-10-CM

## 2014-07-31 DIAGNOSIS — R079 Chest pain, unspecified: Secondary | ICD-10-CM

## 2014-07-31 DIAGNOSIS — R21 Rash and other nonspecific skin eruption: Secondary | ICD-10-CM

## 2014-07-31 LAB — POCT CBC
Granulocyte percent: 58.4 %G (ref 37–80)
HEMATOCRIT: 44 % (ref 37.7–47.9)
Hemoglobin: 13.7 g/dL (ref 12.2–16.2)
LYMPH, POC: 2.1 (ref 0.6–3.4)
MCH: 27.9 pg (ref 27–31.2)
MCHC: 31.2 g/dL — AB (ref 31.8–35.4)
MCV: 89.7 fL (ref 80–97)
MID (cbc): 0.5 (ref 0–0.9)
MPV: 7 fL (ref 0–99.8)
PLATELET COUNT, POC: 237 10*3/uL (ref 142–424)
POC Granulocyte: 3.7 (ref 2–6.9)
POC LYMPH %: 33.8 % (ref 10–50)
POC MID %: 7.8 %M (ref 0–12)
RBC: 4.91 M/uL (ref 4.04–5.48)
RDW, POC: 14.6 %
WBC: 6.3 10*3/uL (ref 4.6–10.2)

## 2014-07-31 MED ORDER — HYDROXYZINE HCL 25 MG PO TABS: 25.0000 mg | ORAL_TABLET | Freq: Three times a day (TID) | ORAL | Status: DC | PRN

## 2014-07-31 MED ORDER — TRIAMCINOLONE ACETONIDE 0.1 % EX CREA: 1.0000 "application " | TOPICAL_CREAM | Freq: Two times a day (BID) | CUTANEOUS | Status: DC

## 2014-07-31 MED ORDER — MELOXICAM 15 MG PO TABS: 15.0000 mg | ORAL_TABLET | Freq: Every day | ORAL | Status: DC

## 2014-07-31 NOTE — Progress Notes (Signed)
[facility] [address] [place] [address] [phone]  Date:  07/31/2014   Name:  Debra Welch   DOB:  [birth year]   MRN:  [id number]  PCP:  No PCP Per Patient    Chief Complaint: Dizziness; Nausea; Shoulder Pain; and Headache   History of Present Illness:  Debra Welch is a 49 y.o. very pleasant female patient who presents with the following:  Patient reports her heart beating fast and hard in the middle of the night.  She reports when she woke up she felt dizziness and the approach of a headache which she generally feels before a headache.  By 7am she took her pulse at 110, she then took propanol.  She notes that when she woke up this morning, she had left sided shoulder pain and stiffness where she was not able to lift her arm to raise a wash cloth.  She has had no trauma.  She denies numbness or tingling to extremity.  At 9:30am she had a nurse take her BP of 132/84 and her pulse 93.  She has no dizziness--though tired, and no diaphoresis, chest pains, sob, or shoulder pain with exertion.  She now has a left sided temple headache with some photophobia.  She denies facial weakness numbness and tingling or in the lower extremity.    Patient Active Problem List   Diagnosis Date Noted  . Headache, variant migraine 06/06/2014  . Tobacco use disorder 06/06/2014  . Sleep disturbance 06/06/2014  . Obesity 06/06/2014     Past Medical History  Diagnosis Date  . Allergy     seasonal    Past Surgical History  Procedure Laterality Date  . Abdominal hysterectomy      History  Substance Use Topics  . Smoking status: Current Every Day Smoker -- 0.50 packs/day for 24 years    Types: Cigarettes  . Smokeless tobacco: Not on file  . Alcohol Use: No    Family History  Problem Relation Age of Onset  . Lupus Mother   . Hypertension Father     Deceased at age 39  . Asthma Mother   . Asthma Sister   . Asthma Maternal Grandmother   . Migraines  Daughter     Allergies  Allergen Reactions  . Demerol [Meperidine] Anaphylaxis and Hives    Medication list has been reviewed and updated.  Current Outpatient Prescriptions on File Prior to Visit  Medication Sig Dispense Refill  . calcium carbonate (TUMS - DOSED IN MG ELEMENTAL CALCIUM) 500 MG chewable tablet Chew 1 tablet by mouth daily as needed for indigestion or heartburn.    . cetirizine (ZYRTEC) 10 MG tablet Take 1 tablet (10 mg total) by mouth daily. 30 tablet 11  . nortriptyline (PAMELOR) 25 MG capsule Take 2 capsules at bedtime for sleep. 60 capsule 3  . propranolol (INDERAL) 40 MG tablet Take 1 tablet (40 mg total) by mouth 2 (two) times daily. 60 tablet 3   No current facility-administered medications on file prior to visit.    Review of Systems: ROS unremarkable unless mentioned above.  Physical Examination: Filed Vitals:   07/31/14 1046  BP: 120/80  Pulse: 90  Temp: 98 F (36.7 C)  Resp: 16   Filed Vitals:   07/31/14 1046  Height: 5' 4.25" (1.632 m)  Weight: 229 lb 3.2 oz (103.964 kg)   Body mass index is 39.03 kg/(m^2). Ideal Body Weight: Weight in (lb) to have BMI = 25: 146.5  Physical Exam  Constitutional: She is oriented to person, place, and time. She appears well-developed and well-nourished. No distress.  HENT:  Head: Normocephalic and atraumatic.  Left Ear: External ear normal.  Nose: Nose normal.  Mouth/Throat: Oropharynx is clear and moist.  Eyes: EOM are normal. Pupils are equal, round, and reactive to light.  Neck: Normal range of motion. Neck supple. No thyromegaly present.  Cardiovascular: Normal rate, regular rhythm, normal heart sounds and intact distal pulses.  Exam reveals no friction rub.   No murmur heard. Pulmonary/Chest: Effort normal and breath sounds normal. No respiratory distress. She has no wheezes.  Abdominal: Soft. Bowel sounds are normal. She exhibits no distension and no mass. There is no tenderness.  Musculoskeletal:  She exhibits no edema.       Left shoulder: She exhibits decreased range of motion (No swelling.  Decreased ROM in all planes.  Guarding very prominent.  Pain with palpation over the deltoid.  ) and pain. She exhibits no bony tenderness, no swelling and no spasm.  Neurological: She is alert and oriented to person, place, and time. No cranial nerve deficit. She exhibits normal muscle tone. Coordination normal.  Skin: Skin is warm and dry. She is not diaphoretic.  Psychiatric: She has a normal mood and affect. Her behavior is normal.    Results for orders placed or performed in visit on 07/31/14  POCT CBC  Result Value Ref Range   WBC 6.3 4.6 - 10.2 K/uL   Lymph, poc 2.1 0.6 - 3.4   POC LYMPH PERCENT 33.8 10 - 50 %L   MID (cbc) 0.5 0 - 0.9   POC MID % 7.8 0 - 12 %M   POC Granulocyte 3.7 2 - 6.9   Granulocyte percent 58.4 37 - 80 %G   RBC 4.91 4.04 - 5.48 M/uL   Hemoglobin 13.7 12.2 - 16.2 g/dL   HCT, POC 47.844.0 29.537.7 - 47.9 %   MCV 89.7 80 - 97 fL   MCH, POC 27.9 27 - 31.2 pg   MCHC 31.2 (A) 31.8 - 35.4 g/dL   RDW, POC 62.114.6 %   Platelet Count, POC 237 142 - 424 K/uL   MPV 7.0 0 - 99.8 fL   [facility] reading (PRIMARY) by  Dr. Perrin MalteseGuest: Possible lytic lesion.  EKG read by Dr. Perrin MalteseGuest: No acute changes  Assessment and Plan: 49 year old female is here for chief complaint of dizziness, left shoulder pain, nausea, and headache.  No acute changes present on exam or imaging.  Orthopedic consultation appreciated for further work up of cystic lesion of left humeral head.  Rash appears less likely associated with beta-blocker.  Ordered triamcinolone acetonide and vistaril.   Palpitations - Plan: POCT CBC, EKG 12 Lead,  Ambulatory referral to Cardiology  Left shoulder pain - Plan: DG Shoulder Left, DG Humerus Left, meloxicam (MOBIC) 15 MG tablet  Rash - Plan: hydrOXYzine (ATARAX/VISTARIL) 25 MG tablet, triamcinolone cream (KENALOG) 0.1 %  Tachycardia - Plan: Ambulatory referral to  Cardiology  Trena PlattStephanie Pammie Chirino, PA-C [facility] 3/16/20169:39 AM

## 2014-07-31 NOTE — Telephone Encounter (Signed)
Error

## 2014-07-31 NOTE — Patient Instructions (Addendum)
Please take 1 tablet of the vistaril at night.  You may use up to 2 at night.  Apply the topical cream onto the chest and arms for up to 2 weeks.  Please let me know if there is any improvement within the next week.  Please await phone call for cardiology referral.  Please apply ice to the shoulder 3-4 times per day for 15 minutes.  Make sure there is a barrier between you and the skin.    Shoulder Exercises EXERCISES  RANGE OF MOTION (ROM) AND STRETCHING EXERCISES These exercises may help you when beginning to rehabilitate your injury. Your symptoms may resolve with or without further involvement from your physician, physical therapist or athletic trainer. While completing these exercises, remember:   Restoring tissue flexibility helps normal motion to return to the joints. This allows healthier, less painful movement and activity.  An effective stretch should be held for at least 30 seconds.  A stretch should never be painful. You should only feel a gentle lengthening or release in the stretched tissue. ROM - Pendulum  Bend at the waist so that your right / left arm falls away from your body. Support yourself with your opposite hand on a solid surface, such as a table or a countertop.  Your right / left arm should be perpendicular to the ground. If it is not perpendicular, you need to lean over farther. Relax the muscles in your right / left arm and shoulder as much as possible.  Gently sway your hips and trunk so they move your right / left arm without any use of your right / left shoulder muscles.  Progress your movements so that your right / left arm moves side to side, then forward and backward, and finally, both clockwise and counterclockwise.  Complete __________ repetitions in each direction. Many people use this exercise to relieve discomfort in their shoulder as well as to gain range of motion. Repeat __________ times. Complete this exercise __________ times per day. STRETCH -  Flexion, Standing  Stand with good posture. With an underhand grip on your right / left hand and an overhand grip on the opposite hand, grasp a broomstick or cane so that your hands are a little more than shoulder-width apart.  Keeping your right / left elbow straight and shoulder muscles relaxed, push the stick with your opposite hand to raise your right / left arm in front of your body and then overhead. Raise your arm until you feel a stretch in your right / left shoulder, but before you have increased shoulder pain.  Try to avoid shrugging your right / left shoulder as your arm rises by keeping your shoulder blade tucked down and toward your mid-back spine. Hold __________ seconds.  Slowly return to the starting position. Repeat __________ times. Complete this exercise __________ times per day. STRETCH - Internal Rotation  Place your right / left hand behind your back, palm-up.  Throw a towel or belt over your opposite shoulder. Grasp the towel/belt with your right / left hand.  While keeping an upright posture, gently pull up on the towel/belt until you feel a stretch in the front of your right / left shoulder.  Avoid shrugging your right / left shoulder as your arm rises by keeping your shoulder blade tucked down and toward your mid-back spine.  Hold __________. Release the stretch by lowering your opposite hand. Repeat __________ times. Complete this exercise __________ times per day. STRETCH - External Rotation and Abduction  Stagger  your stance through a doorframe. It does not matter which foot is forward.  As instructed by your physician, physical therapist or athletic trainer, place your hands:  And forearms above your head and on the door frame.  And forearms at head-height and on the door frame.  At elbow-height and on the door frame.  Keeping your head and chest upright and your stomach muscles tight to prevent over-extending your low-back, slowly shift your weight  onto your front foot until you feel a stretch across your chest and/or in the front of your shoulders.  Hold __________ seconds. Shift your weight to your back foot to release the stretch. Repeat __________ times. Complete this stretch __________ times per day.  STRENGTHENING EXERCISES  These exercises may help you when beginning to rehabilitate your injury. They may resolve your symptoms with or without further involvement from your physician, physical therapist or athletic trainer. While completing these exercises, remember:   Muscles can gain both the endurance and the strength needed for everyday activities through controlled exercises.  Complete these exercises as instructed by your physician, physical therapist or athletic trainer. Progress the resistance and repetitions only as guided.  You may experience muscle soreness or fatigue, but the pain or discomfort you are trying to eliminate should never worsen during these exercises. If this pain does worsen, stop and make certain you are following the directions exactly. If the pain is still present after adjustments, discontinue the exercise until you can discuss the trouble with your clinician.  If advised by your physician, during your recovery, avoid activity or exercises which involve actions that place your right / left hand or elbow above your head or behind your back or head. These positions stress the tissues which are trying to heal. STRENGTH - Scapular Depression and Adduction  With good posture, sit on a firm chair. Supported your arms in front of you with pillows, arm rests or a table top. Have your elbows in line with the sides of your body.  Gently draw your shoulder blades down and toward your mid-back spine. Gradually increase the tension without tensing the muscles along the top of your shoulders and the back of your neck.  Hold for __________ seconds. Slowly release the tension and relax your muscles completely before  completing the next repetition.  After you have practiced this exercise, remove the arm support and complete it in standing as well as sitting. Repeat __________ times. Complete this exercise __________ times per day.  STRENGTH - External Rotators  Secure a rubber exercise band/tubing to a fixed object so that it is at the same height as your right / left elbow when you are standing or sitting on a firm surface.  Stand or sit so that the secured exercise band/tubing is at your side that is not injured.  Bend your elbow 90 degrees. Place a folded towel or small pillow under your right / left arm so that your elbow is a few inches away from your side.  Keeping the tension on the exercise band/tubing, pull it away from your body, as if pivoting on your elbow. Be sure to keep your body steady so that the movement is only coming from your shoulder rotating.  Hold __________ seconds. Release the tension in a controlled manner as you return to the starting position. Repeat __________ times. Complete this exercise __________ times per day.  STRENGTH - Supraspinatus  Stand or sit with good posture. Grasp a __________ weight or an exercise band/tubing so  that your hand is "thumbs-up," like when you shake hands.  Slowly lift your right / left hand from your thigh into the air, traveling about 30 degrees from straight out at your side. Lift your hand to shoulder height or as far as you can without increasing any shoulder pain. Initially, many people do not lift their hands above shoulder height.  Avoid shrugging your right / left shoulder as your arm rises by keeping your shoulder blade tucked down and toward your mid-back spine.  Hold for __________ seconds. Control the descent of your hand as you slowly return to your starting position. Repeat __________ times. Complete this exercise __________ times per day.  STRENGTH - Shoulder Extensors  Secure a rubber exercise band/tubing so that it is at the  height of your shoulders when you are either standing or sitting on a firm arm-less chair.  With a thumbs-up grip, grasp an end of the band/tubing in each hand. Straighten your elbows and lift your hands straight in front of you at shoulder height. Step back away from the secured end of band/tubing until it becomes tense.  Squeezing your shoulder blades together, pull your hands down to the sides of your thighs. Do not allow your hands to go behind you.  Hold for __________ seconds. Slowly ease the tension on the band/tubing as you reverse the directions and return to the starting position. Repeat __________ times. Complete this exercise __________ times per day.  STRENGTH - Scapular Retractors  Secure a rubber exercise band/tubing so that it is at the height of your shoulders when you are either standing or sitting on a firm arm-less chair.  With a palm-down grip, grasp an end of the band/tubing in each hand. Straighten your elbows and lift your hands straight in front of you at shoulder height. Step back away from the secured end of band/tubing until it becomes tense.  Squeezing your shoulder blades together, draw your elbows back as you bend them. Keep your upper arm lifted away from your body throughout the exercise.  Hold __________ seconds. Slowly ease the tension on the band/tubing as you reverse the directions and return to the starting position. Repeat __________ times. Complete this exercise __________ times per day. STRENGTH - Scapular Depressors  Find a sturdy chair without wheels, such as a from a dining room table.  Keeping your feet on the floor, lift your bottom from the seat and lock your elbows.  Keeping your elbows straight, allow gravity to pull your body weight down. Your shoulders will rise toward your ears.  Raise your body against gravity by drawing your shoulder blades down your back, shortening the distance between your shoulders and ears. Although your feet should  always maintain contact with the floor, your feet should progressively support less body weight as you get stronger.  Hold __________ seconds. In a controlled and slow manner, lower your body weight to begin the next repetition. Repeat __________ times. Complete this exercise __________ times per day.  Document Released: 03/19/2005 Document Revised: 07/28/2011 Document Reviewed: 08/17/2008 [facility] Patient Information 2015 [employer] [place]. This information is not intended to replace advice given to you by your health care provider. Make sure you discuss any questions you have with your health care provider.

## 2014-08-02 ENCOUNTER — Other Ambulatory Visit: Payer: Self-pay | Admitting: Physician Assistant

## 2014-08-02 DIAGNOSIS — M25512 Pain in left shoulder: Secondary | ICD-10-CM

## 2014-08-02 DIAGNOSIS — M8560 Other cyst of bone, unspecified site: Secondary | ICD-10-CM

## 2014-08-03 ENCOUNTER — Ambulatory Visit: Payer: PRIVATE HEALTH INSURANCE | Admitting: Family Medicine

## 2014-09-15 ENCOUNTER — Encounter: Payer: Self-pay | Admitting: Cardiovascular Disease

## 2014-09-15 ENCOUNTER — Ambulatory Visit (INDEPENDENT_AMBULATORY_CARE_PROVIDER_SITE_OTHER): Payer: PRIVATE HEALTH INSURANCE | Admitting: Cardiovascular Disease

## 2014-09-15 VITALS — BP 120/74 | HR 79 | Ht 64.0 in | Wt 230.2 lb

## 2014-09-15 DIAGNOSIS — F17200 Nicotine dependence, unspecified, uncomplicated: Secondary | ICD-10-CM

## 2014-09-15 DIAGNOSIS — E669 Obesity, unspecified: Secondary | ICD-10-CM | POA: Diagnosis not present

## 2014-09-15 DIAGNOSIS — Z720 Tobacco use: Secondary | ICD-10-CM | POA: Diagnosis not present

## 2014-09-15 DIAGNOSIS — G479 Sleep disorder, unspecified: Secondary | ICD-10-CM | POA: Diagnosis not present

## 2014-09-15 DIAGNOSIS — R002 Palpitations: Secondary | ICD-10-CM

## 2014-09-15 NOTE — Progress Notes (Signed)
Patient ID: Debra Welch, female   DOB: February 27, 1966, 49 y.o.   MRN: [id number]     Cardiology Office Note   Date:  09/16/2014   ID:  Debra Welch, DOB 07/15/1965, MRN [id number]  PCP:  No PCP Per Patient  Cardiologist:   Thurmon Fair, MD   Chief Complaint  Patient presents with  . New Evaluation    Palpitations, tachycardia      History of Present Illness: Debra Welch is a 49 y.o. female who presents for  Evaluation of palpitations. Several weeks ago she woke up with a pounding heartbeat. She describes it as very regular and strong and not necessarily fast. She also heard pounding in her years and was scared of the consequences. The onset of the arrhythmia was very abrupt and woke her from sleep. The cessation was more gradual.  The palpitations have not occurred recently and she is less concerned about them.  The palpitations often seem to signal the onset of a migraine headache. The heart rate is generally only slightly elevated between 1910. Blood pressure is normal when she has her headaches.   She also has migraine headaches.  She was started on propranolol but unfortunately this has not helped reduce the frequency of severity of her migraines.   At least 4 days a week, Debra Welch exercises in the gym between 30 and 90 minutes doing fairly intense cardio and weight exercises. She never has palpitations during exercise and also denies problems with shortness of breath, chest discomfort, dizziness or syncope during or following exercise.   She does not have a history of hypertension, diabetes or  Hypercholesterolemia. Labs performed in January showed hypertriglyceridemia but may not have been fasting. There is not a strong family history of coronary disease , at least not at a young age. She has been smoking a half pack cigarettes a day since age 68, a roughly 9-pack-year history.    Past Medical History  Diagnosis Date  . Allergy     seasonal    Past Surgical History   Procedure Laterality Date  . Abdominal hysterectomy       Current Outpatient Prescriptions  Medication Sig Dispense Refill  . calcium carbonate (TUMS - DOSED IN MG ELEMENTAL CALCIUM) 500 MG chewable tablet Chew 1 tablet by mouth daily as needed for indigestion or heartburn.    . cetirizine (ZYRTEC) 10 MG tablet Take 1 tablet (10 mg total) by mouth daily. 30 tablet 11  . hydrOXYzine (ATARAX/VISTARIL) 25 MG tablet Take 1 tablet (25 mg total) by mouth every 8 (eight) hours as needed for itching. 30 tablet 0  . meloxicam (MOBIC) 15 MG tablet Take 1 tablet (15 mg total) by mouth daily. (Patient taking differently: Take 15 mg by mouth as needed for pain. ) 30 tablet 1  . nortriptyline (PAMELOR) 25 MG capsule Take 2 capsules at bedtime for sleep. 60 capsule 3  . propranolol (INDERAL) 40 MG tablet Take 1 tablet (40 mg total) by mouth 2 (two) times daily. 60 tablet 3  . triamcinolone cream (KENALOG) 0.1 % Apply 1 application topically 2 (two) times daily. (Patient taking differently: Apply 1 application topically as needed (rash). ) 30 g 0   No current facility-administered medications for this visit.    Allergies:   Demerol    Social History:  The patient  reports that she has been smoking Cigarettes.  She has a 12 pack-year smoking history. She does not have any smokeless tobacco history on file. She reports  that she does not drink alcohol or use illicit drugs.   Family History:  The patient's family history includes Asthma in her maternal grandmother, mother, and sister; Hypertension in her father; Lupus in her mother; Migraines in her daughter.    ROS:  Please see the history of present illness.    Otherwise, review of systems positive for none.   All other systems are reviewed and negative.    PHYSICAL EXAM: VS:  BP 120/74 mmHg  Pulse 79  Ht 5\' 4"  (1.626 m)  Wt 230 lb 3.2 oz (104.418 kg)  BMI 39.49 kg/m2 , BMI Body mass index is 39.49 kg/(m^2).  General: Alert, oriented x3, no  distress Head: no evidence of trauma, PERRL, EOMI, no exophtalmos or lid lag, no myxedema, no xanthelasma; normal ears, nose and oropharynx Neck: normal jugular venous pulsations and no hepatojugular reflux; brisk carotid pulses without delay and no carotid bruits Chest: clear to auscultation, no signs of consolidation by percussion or palpation, normal fremitus, symmetrical and full respiratory excursions Cardiovascular: normal position and quality of the apical impulse, regular rhythm, normal first and second heart sounds, no murmurs, rubs or gallops Abdomen: no tenderness or distention, no masses by palpation, no abnormal pulsatility or arterial bruits, normal bowel sounds, no hepatosplenomegaly Extremities: no clubbing, cyanosis or edema; 2+ radial, ulnar and brachial pulses bilaterally; 2+ right femoral, posterior tibial and dorsalis pedis pulses; 2+ left femoral, posterior tibial and dorsalis pedis pulses; no subclavian or femoral bruits Neurological: grossly nonfocal Psych: euthymic mood, full affect   EKG:  EKG is ordered today. The ekg ordered today demonstrates  Normal sinus rhythm, normal tracing except for borderline QTC of 460 ms   Recent Labs: 06/06/2014: ALT 18; BUN 11; Creatinine 0.96; Platelets 223; Potassium 4.1; Sodium 137; TSH 1.858 07/31/2014: Hemoglobin 13.7    Lipid Panel    Component Value Date/Time   CHOL 185 06/06/2014 1625   TRIG 604* 06/06/2014 1625   HDL 34* 06/06/2014 1625   CHOLHDL 5.4 06/06/2014 1625   VLDL NOT CALC 06/06/2014 1625   LDLCALC NOT CALC 06/06/2014 1625      Wt Readings from Last 3 Encounters:  09/15/14 230 lb 3.2 oz (104.418 kg)  07/31/14 229 lb 3.2 oz (103.964 kg)  07/05/14 229 lb (103.874 kg)      ASSESSMENT AND PLAN:   by history, Mrs. Debra Welch has palpitations related to mild sinus tachycardia that is probably physiological , related to the discomfort of her headaches. There are no features to suggest that she has atrial  fibrillation or a malignant ventricular arrhythmia. With the exception of severe obesity, her physical exam is normal.   We discussed options of further evaluation of the arrhythmia but she feels reassured and would rather not have additional testing , unless the problem returns. It has subsided completely now. She would also like to stop the propranolol since she wants to limit the number of medications that she takes. We discussed the risks of rebound tachycardia or rebound hypertension with abrupt cessation of beta blocker and she understands that she needs to "wean " this medication.   she can still use the propranolol as an "as needed" medication for palpitations.   If her palpitations return or worsen or she develops other cardiac symptoms she will contact us for a follow-up visit. Otherwise she will only follow up as needed.   Current medicines are reviewed at length with the patient today.  The patient has concerns regarding medicines. See above  The  following changes have been made:  wean propranolol  Labs/ tests ordered today include:  Orders Placed This Encounter  Procedures  . EKG 12-Lead   Patient Instructions  Dr. Royann Shivers recommends that you schedule a follow-up appointment in: AS NEEDED.  You can gradually wean yourself off the Propanolol - take 1/2 tablet twice a day x 2 days then 1/4 tablet x 3 days then once a day x 2 days.       Debra Bimler, MD  09/16/2014 7:04 PM    Thurmon Fair, MD, [facility] [phone] office [phone] pager

## 2014-09-15 NOTE — Patient Instructions (Addendum)
Dr. Royann Shiversroitoru recommends that you schedule a follow-up appointment in: AS NEEDED.  You can gradually wean yourself off the Propanolol - take 1/2 tablet twice a day x 2 days then 1/4 tablet x 3 days then once a day x 2 days.

## 2015-01-25 ENCOUNTER — Encounter (HOSPITAL_COMMUNITY): Payer: Self-pay | Admitting: Emergency Medicine

## 2015-01-25 ENCOUNTER — Emergency Department (HOSPITAL_COMMUNITY)
Admission: EM | Admit: 2015-01-25 | Discharge: 2015-01-25 | Disposition: A | Discharge status: Home / Self Care | Payer: PRIVATE HEALTH INSURANCE | Attending: Emergency Medicine | Admitting: Emergency Medicine

## 2015-01-25 DIAGNOSIS — G8929 Other chronic pain: Secondary | ICD-10-CM | POA: Diagnosis not present

## 2015-01-25 DIAGNOSIS — G43809 Other migraine, not intractable, without status migrainosus: Secondary | ICD-10-CM

## 2015-01-25 DIAGNOSIS — R51 Headache: Secondary | ICD-10-CM | POA: Diagnosis present

## 2015-01-25 DIAGNOSIS — Z720 Tobacco use: Secondary | ICD-10-CM | POA: Diagnosis not present

## 2015-01-25 HISTORY — DX: Migraine, unspecified, not intractable, without status migrainosus: G43.909

## 2015-01-25 MED ORDER — DIPHENHYDRAMINE HCL 50 MG/ML IJ SOLN
50.0000 mg | Freq: Once | INTRAMUSCULAR | Status: AC
  Administered 2015-01-25: 50 mg via INTRAMUSCULAR
  Filled 2015-01-25: qty 1

## 2015-01-25 MED ORDER — KETOROLAC TROMETHAMINE 60 MG/2ML IM SOLN
60.0000 mg | Freq: Once | INTRAMUSCULAR | Status: AC
  Administered 2015-01-25: 60 mg via INTRAMUSCULAR
  Filled 2015-01-25: qty 2

## 2015-01-25 MED ORDER — ACETAMINOPHEN 500 MG PO TABS
1000.0000 mg | ORAL_TABLET | Freq: Once | ORAL | Status: AC
  Administered 2015-01-25: 1000 mg via ORAL
  Filled 2015-01-25: qty 2

## 2015-01-25 MED ORDER — METOCLOPRAMIDE HCL 10 MG PO TABS: 10.0000 mg | ORAL_TABLET | Freq: Four times a day (QID) | ORAL | Status: DC | PRN

## 2015-01-25 MED ORDER — METOCLOPRAMIDE HCL 5 MG/ML IJ SOLN
10.0000 mg | Freq: Once | INTRAMUSCULAR | Status: AC
  Administered 2015-01-25: 10 mg via INTRAMUSCULAR
  Filled 2015-01-25: qty 2

## 2015-01-25 NOTE — ED Notes (Signed)
pt states that at work earlier she wasn't feeling well and got the nurse at work to take her BP first time 150s/80s then again 190s/90s. Pt states that she has a headache with light sensitivity and nausea but denies vomiting.

## 2015-01-25 NOTE — ED Provider Notes (Signed)
CSN: [id number]     Arrival date & time 01/25/15  1518 History   First MD Initiated Contact with Patient 01/25/15 1640     Chief Complaint  Patient presents with  . Hypertension  . Headache      HPI Pt was seen at 1650. Per pt, c/o gradual onset and persistence of constant acute flair of her chronic migraine headache since this morning.  Describes the headache as frontal, "throbbing," per her usual chronic migraine headache pain pattern for many years. Has been associated with nausea.  Denies headache was sudden or maximal in onset or at any time.  Denies visual changes, no focal motor weakness, no tingling/numbness in extremities, no fevers, no neck pain, no rash.      Past Medical History  Diagnosis Date  . Allergy     seasonal  . Migraines    Past Surgical History  Procedure Laterality Date  . Abdominal hysterectomy     Family History  Problem Relation Age of Onset  . Lupus Mother   . Hypertension Father     Deceased at age 16  . Asthma Mother   . Asthma Sister   . Asthma Maternal Grandmother   . Migraines Daughter    Social History  Substance Use Topics  . Smoking status: Current Every Day Smoker -- 0.50 packs/day for 24 years    Types: Cigarettes  . Smokeless tobacco: None  . Alcohol Use: No   OB History    Gravida Para Term Preterm AB TAB SAB Ectopic Multiple Living   Review of Systems ROS: Statement: All systems negative except as marked or noted in the HPI; Constitutional: Negative for fever and chills. ; ; Eyes: Negative for eye pain, redness and discharge. ; ; ENMT: Negative for ear pain, hoarseness, nasal congestion, sinus pressure and sore throat. ; ; Cardiovascular: Negative for chest pain, palpitations, diaphoresis, dyspnea and peripheral edema. ; ; Respiratory: Negative for cough, wheezing and stridor. ; ; Gastrointestinal: +nausea. Negative for vomiting, diarrhea, abdominal pain, blood in stool, hematemesis, jaundice and rectal bleeding.  . ; ; Genitourinary: Negative for dysuria, flank pain and hematuria. ; ; Musculoskeletal: Negative for back pain and neck pain. Negative for swelling and trauma.; ; Skin: Negative for pruritus, rash, abrasions, blisters, bruising and skin lesion.; ; Neuro: +headache. Negative for lightheadedness and neck stiffness. Negative for weakness, altered level of consciousness , altered mental status, extremity weakness, paresthesias, involuntary movement, seizure and syncope.      Allergies  Demerol  Home Medications   Prior to Admission medications   Medication Sig Start Date End Date Taking? Authorizing Provider  cetirizine (ZYRTEC) 10 MG tablet Take 1 tablet (10 mg total) by mouth daily. Patient not taking: Reported on 01/25/2015 03/08/14   Wallis Bamberg, PA-C  hydrOXYzine (ATARAX/VISTARIL) 25 MG tablet Take 1 tablet (25 mg total) by mouth every 8 (eight) hours as needed for itching. Patient not taking: Reported on 01/25/2015 07/31/14   Collie Siad English, PA  meloxicam (MOBIC) 15 MG tablet Take 1 tablet (15 mg total) by mouth daily. Patient not taking: Reported on 01/25/2015 07/31/14   Collie Siad English, PA  nortriptyline (PAMELOR) 25 MG capsule Take 2 capsules at bedtime for sleep. Patient not taking: Reported on 01/25/2015 07/05/14   Maurice March, MD  propranolol (INDERAL) 40 MG tablet Take 1 tablet (40 mg total) by mouth 2 (two) times daily. Patient not taking:  Reported on 01/25/2015 07/05/14   Maurice March, MD  triamcinolone cream (KENALOG) 0.1 % Apply 1 application topically 2 (two) times daily. Patient not taking: Reported on 01/25/2015 07/31/14   Judeth Cornfield D English, PA   BP 152/81 mmHg  Pulse 105  Temp(Src) 98.6 F (37 C) (Oral)  Resp 17  SpO2 96% Physical Exam  1655: Physical examination:  Nursing notes reviewed; Vital signs and O2 SAT reviewed;  Constitutional: Well developed, Well nourished, Well hydrated, In no acute distress; Head:  Normocephalic, atraumatic; Eyes: EOMI, PERRL,  No scleral icterus; ENMT: TM's clear bilat. +edemetous nasal turbinates bilat with clear rhinorrhea. Mouth and pharynx normal, Mucous membranes moist; Neck: Supple, Full range of motion, No lymphadenopathy; Cardiovascular: Regular rate and rhythm, No murmur, rub, or gallop; Respiratory: Breath sounds clear & equal bilaterally, No rales, rhonchi, wheezes.  Speaking full sentences with ease, Normal respiratory effort/excursion; Chest: Nontender, Movement normal; Abdomen: Soft, Nontender, Nondistended, Normal bowel sounds; Genitourinary: No CVA tenderness; Extremities: Pulses normal, No tenderness, No edema, No calf edema or asymmetry.; Neuro: AA&Ox3, Major CN grossly intact. No facial droop. Speech clear. No gross focal motor or sensory deficits in extremities.; Skin: Color normal, Warm, Dry.   ED Course  Procedures (including critical care time) Labs Review   Imaging Review  I have personally reviewed and evaluated these images and lab results as part of my medical decision-making.   EKG Interpretation None      MDM  MDM Reviewed: previous chart, nursing note and vitals      1715:  No red flags on HPI. Tx symptomatically at this time. Dx d/w pt.  Questions answered.  Verb understanding, agreeable to d/c home with outpt f/u.    Samuel Jester, DO 01/28/15 1053

## 2015-01-25 NOTE — Discharge Instructions (Signed)
°Emergency Department Resource Guide °1) Find a Doctor and Pay Out of Pocket °Although you won't have to find out who is covered by your insurance plan, it is a good idea to ask around and get recommendations. You will then need to call the office and see if the doctor you have chosen will accept you as a new patient and what types of options they offer for patients who are self-pay. Some doctors offer discounts or will set up payment plans for their patients who do not have insurance, but you will need to ask so you aren't surprised when you get to your appointment. ° °2) Contact Your Local Health Department °Not all health departments have doctors that can see patients for sick visits, but many do, so it is worth a call to see if yours does. If you don't know where your local health department is, you can check in your phone book. The CDC also has a tool to help you locate your state's health department, and many state websites also have listings of all of their local health departments. ° °3) Find a Walk-in Clinic °If your illness is not likely to be very severe or complicated, you may want to try a walk in clinic. These are popping up all over the country in pharmacies, drugstores, and shopping centers. They're usually staffed by nurse practitioners or physician assistants that have been trained to treat common illnesses and complaints. They're usually fairly quick and inexpensive. However, if you have serious medical issues or chronic medical problems, these are probably not your best option. ° °No Primary Care Doctor: °- Call [facility] at  832-8000 - they can help you locate a primary care doctor that  accepts your insurance, provides certain services, etc. °- Physician Referral Service- 1-800-533-3463 ° °Chronic Pain Problems: °Organization         Address  Phone   Notes  °[facility]  (336) 297-2271 Patients need to be referred by their primary care doctor.  ° °Medication  Assistance: °Organization         Address  Phone   Notes  °[place] [facility] [address] °[place], [place] [address] (336) 641-8030 --Must be a resident of [place] °-- Must have NO insurance coverage whatsoever (no Medicaid/ Medicare, etc.) °-- The pt. MUST have a primary care doctor that directs their care regularly and follows them in the community °  °[facility]  (866) 331-1348   °[employer]  (888) 892-1162   ° °Agencies that provide inexpensive medical care: °Organization         Address  Phone   Notes  °[facility]  (336) 832-8035   ° Internal Medicine    (336) 832-7272   °[facility] [address] °[place], [place] [address] (336) 832-4777   °[facility] [address], °[place] (336) 271-4999   °[facility]    (336) 373-0678   °[facility]    (336) 272-1050   °[facility] ° [address], [place] Phone:  (336) 832-4444, Fax:  (336) 832-4440 Hours of Operation:  9 am - 6 pm, M-F.  Also accepts Medicaid/Medicare and self-pay.  °[facility] ° [address],  Phone: (336) 832-3150, Fax: (336) 832-3151. Hours of Operation:  8:30 am - 5:30 pm, M-F.  Also accepts Medicaid and self-pay.  °[facility] [place] [address], [place] Phone: (336) 878-6027   °[facility] [address], [place], [place] (336)723-1848, Ext. 123 Mondays & Thursdays: 7-9 AM.  First 15 patients are seen on a first come, first serve basis. °  ° °Medicaid-accepting [place] Providers: ° °Organization         Address  Phone   Notes  °[facility] [address], [place] (336) 641-2100 Also accepts self-pay patients.  °[facility] [address], [place] ° (336) 856-9996   °[facility] [address], [place]  (336) 288-8857   °[facility] [address] [place] [address], [place] (336) 299-7000   °Veita Bland [address], [place]  ° (336) 373-1557 Only accepts [place] Access Medicaid patients after they have their name applied to their card.  ° °Self-Pay (no insurance) in [place]: ° °Organization         Address  Phone   Notes  °Sickle Cell Patients, [facility] [address], [place] (336) 832-1970   °[facility] [address], [place] (336) 832-4400   °[facility] ° [address], [place] (336) 992-4800   °[facility]/Dr. Osei-Bonsu ° [address] [place] [address], [place] or [address], [place] (336) 841-8500 Phone number for both [place] and [place] locations is the same.  °[facility] [address], [place] (336) 299-0000   °[facility] [address] [place] [address], [place] or [address] (336) 852-7530 °(336) 878-2260   °[facility] [address], [place] (336) 350-1642, phone; (336) 294-5005, fax Sees patients 1st and 3rd Saturday of every month.  Must not qualify for public or private insurance (i.e. Medicaid, Medicare, [place] Health Choice, Veterans' Benefits) • Household income should be no more than 200% of the poverty level •The clinic cannot treat you if you are pregnant or think you are pregnant • Sexually transmitted diseases are not treated at the clinic.  ° ° °Dental Care: °Organization         Address  Phone  Notes  °[place] [facility] [address], [place] (336) 641-6152 Accepts children up to age 21 who are enrolled in Medicaid or [place] Health Choice; pregnant women with a Medicaid card; and children who have applied for Medicaid or [place] Health Choice, but were declined, whose parents can pay a reduced fee at time of service.  °[place]  [facility] [place]  [address], [place] (336) 641-7733 Accepts children up to age 21 who are enrolled in Medicaid or [place] Health Choice; pregnant women with a Medicaid card; and children who have applied for Medicaid or  Health Choice, but were declined, whose parents can pay a reduced fee at time of service.  °[facility] ° [address], [place] (336) 641-4533 Patients are seen by appointment only. Walk-ins are not accepted. [facility] will see patients 18 years of age and older. °Monday - Tuesday (8am-5pm) °Most Wednesdays (8:30-5pm) °$30 per visit, cash only  °[facility] ° [address], [place] (336) 641-4533 Patients are seen by appointment only. Walk-ins are not accepted. [facility] will see patients 18 years of age and older. °One   Wednesday Evening (Monthly: Volunteer Based).  $30 per visit, cash only  °[facility]  (919) 537-3737 for adults; Children under age 4, call Graduate Pediatric Dentistry at (919) [phone]. Children aged 4-14, please call (919) 537-3737 to request a pediatric application. ° Dental services are provided in all areas of dental care including fillings, crowns and bridges, complete and partial dentures, implants, gum treatment, root canals, and extractions. Preventive care is also provided. Treatment is provided to both adults and children. °Patients are selected via a lottery and there is often a waiting list. °  °[facility] [address], °[place] ° (336) 763-8833 [email or web address] °  °[facility] [address], [place], [place] (336)723-1848, Ext. 123 Second and Fourth Thursday of each month, opens at 6:30 AM; Clinic ends at 9 AM.  Patients are seen on a first-come first-served basis, and a limited number are seen during each clinic.  ° °[facility] ° [address], [place], [place] (336) 723-7904    Eligibility Requirements °You must have lived in [place], or [place] counties for at least the last three months. °  You cannot be eligible for state or federal sponsored healthcare insurance, including Veterans Administration, Medicaid, or Medicare. °  You generally cannot be eligible for healthcare insurance through your employer.  °  How to apply: °Eligibility screenings are held every Tuesday and Wednesday afternoon from 1:00 pm until 4:00 pm. You do not need an appointment for the interview!  °[facility] [address], [place], [place] 336-631-2330   °[facility]  336-342-8273   °[facility]  336-703-3100   °[facility]  336-570-6415   ° °Behavioral Health Resources in the Community: °Intensive Outpatient Programs °Organization         Address  Phone  Notes  °[facility] [address], [place], [place] 336-878-6098   °[facility] [address], [place], [place] 336-832-9800   °[facility] [address], [place], [place] ° 336-882-2125   °[facility] [address],  °[place], [place] 1-800-853-5163 or 336-641-4981   °Substance Abuse Resources °Organization         Address  Phone  Notes  °[facility]  336-882-2125   °[facility]  336-784-9470   °[facility]  336-285-9073   °[facility]  336-845-3988   °Residential & Outpatient Substance Abuse Program  1-800-659-3381   °Psychological Services °Organization         Address  Phone  Notes  °[facility]  336- 832-9600   °[facility]  336- 378-7881   °[facility] [address], [place] 1-800-853-5163 or 336-641-4981   ° °Mobile Crisis Teams °Organization         Address  Phone  Notes  °[facility], Mobile Crisis Care Unit  1-877-626-1772   °Assertive °[facility] ° [address]  [place], [place] 336-834-9664   °Sharon DeEsch [address] °[place] 336-554-5454   ° °Self-Help/Support Groups °Organization         Address  Phone             Notes  °[facility]  - variety of support groups  336- 373-1402 Call for more information  °Narcotics Anonymous (NA), [facility] [address], °[place]  2 meetings at this location  ° °  Residential Treatment Programs °Organization         Address  Phone  Notes  °[facility] [address],    °[place]  1-866-801-8205   °[facility] ° [address], [place], [place] 704-293-8524   °[facility] [address], [place] 336-845-3988 Admissions: 8am-3pm M-F  °[facility] [address]    °[place], [place] 336-841-1104   °[facility] [address], [place], [place] 336-379-7146   °[facility] [address]  °[place], [place] 336-285-9073   °[facility] Intensive Outpatient [address], [place], [place] 336-852-3033   °[facility] [address]  °[place], [place] 1-877-615-2722 or 336-784-9470   °[facility] [address] [place], [place] 336-227-7417 Accepts Medicaid  °[facility] [address]  °[place] 1-800-659-3381 Substance Abuse/Addiction Treatment  ° °[place] Behavioral Health Resources °Organization         Address  Phone  Notes  °[facility]  (888) 581-9988   °Julie Brannon, PhD [address] [place], [place]   (336) 349-5553 or (336) 951-0000   °[facility]   [address] °[place], [place] (336) 349-4454   °[facility] [address], [place], [place] (336) 342-8316 Insurance/Medicaid/sponsorship through [facility]  °[facility] [address]                                    [place], [place] (336) 342-8316 Therapy/tele-psych/case    °[facility] [address]  ° [place], [place] (336) 349-2233    °Dr. Arfeen  (336) 349-4544   °[facility] [place]  [employer] [place] [facility] 1) [address], [place] °2) [address], [place] °3)  [address], [place] (336) 349-3220 °(336) 342-7768 ° °(336) 342-8140   °[place] [facility] (336) 342-1394 or (336) 342-3537 (After Hours)    ° ° °Take over the counter tylenol and ibuprofen (OR excedrin) and benadryl, as directed on packaging, with the prescription given to you today, as needed for headache.  Keep a headache diary.  Call your regular medical doctor tomorrow to schedule a follow up appointment within the next 3 days.  Return to the Emergency Department immediately sooner if worsening.  ° °

## 2015-03-09 ENCOUNTER — Emergency Department (HOSPITAL_COMMUNITY)
Admission: EM | Admit: 2015-03-09 | Discharge: 2015-03-09 | Disposition: A | Discharge status: Home / Self Care | Payer: PRIVATE HEALTH INSURANCE | Attending: Emergency Medicine | Admitting: Emergency Medicine

## 2015-03-09 ENCOUNTER — Encounter (HOSPITAL_COMMUNITY): Payer: Self-pay | Admitting: Emergency Medicine

## 2015-03-09 ENCOUNTER — Other Ambulatory Visit: Payer: Self-pay | Admitting: Urgent Care

## 2015-03-09 ENCOUNTER — Emergency Department (HOSPITAL_COMMUNITY): Payer: PRIVATE HEALTH INSURANCE

## 2015-03-09 DIAGNOSIS — G43909 Migraine, unspecified, not intractable, without status migrainosus: Secondary | ICD-10-CM | POA: Insufficient documentation

## 2015-03-09 DIAGNOSIS — M79641 Pain in right hand: Secondary | ICD-10-CM | POA: Insufficient documentation

## 2015-03-09 DIAGNOSIS — M2580 Other specified joint disorders, unspecified joint: Secondary | ICD-10-CM

## 2015-03-09 DIAGNOSIS — M25841 Other specified joint disorders, right hand: Secondary | ICD-10-CM | POA: Insufficient documentation

## 2015-03-09 DIAGNOSIS — Z720 Tobacco use: Secondary | ICD-10-CM | POA: Diagnosis not present

## 2015-03-09 HISTORY — DX: Bursopathy, unspecified: M71.9

## 2015-03-09 MED ORDER — PREDNISONE 50 MG PO TABS: 50.0000 mg | ORAL_TABLET | Freq: Every day | ORAL | Status: DC

## 2015-03-09 MED ORDER — HYDROCODONE-ACETAMINOPHEN 5-325 MG PO TABS: 1.0000 | ORAL_TABLET | Freq: Four times a day (QID) | ORAL | Status: DC | PRN

## 2015-03-09 NOTE — ED Notes (Signed)
Pt reports r/hand pain x 1 week. Pain increased to intermittent numbness over last 2 days

## 2015-03-09 NOTE — Discharge Instructions (Signed)
Return here as needed.  Follow-up with the orthopedist provided.  The x-rays show any significant abnormalities

## 2015-03-09 NOTE — ED Provider Notes (Signed)
CSN: [id number]     Arrival date & time 03/09/15  1245 History  By signing my name below, I, [name], attest that this documentation has been prepared under the direction and in the presence of non-physician practitioner, Ebbie Ridgehris Ivone Licht, PA-C. Electronically Signed: [name], Scribe. 03/09/2015. 1:05 PM.   Chief Complaint  Patient presents with  . Hand Pain    1 week hx of hand pain   The history is provided by the patient. No language interpreter was used.    HPI Comments:  Debra Welch is a 49 y.o. female who presents to the Emergency Department complaining of mild-moderate right hand pain for ~ 1 week. She reports associated tingling/numbness and describes a pins and needle sensation at the site. Pt also notes there is a knot protruding from the base of her right middle finger. No alleviating factors noted. She denies recent injury/fall.   Past Medical History  Diagnosis Date  . Allergy     seasonal  . Migraines    Past Surgical History  Procedure Laterality Date  . Abdominal hysterectomy     Family History  Problem Relation Age of Onset  . Lupus Mother   . Hypertension Father     Deceased at age 48  . Asthma Mother   . Asthma Sister   . Asthma Maternal Grandmother   . Migraines Daughter    Social History  Substance Use Topics  . Smoking status: Current Every Day Smoker -- 0.50 packs/day for 24 years    Types: Cigarettes  . Smokeless tobacco: None  . Alcohol Use: No   OB History    Gravida Para Term Preterm AB TAB SAB Ectopic Multiple Living   3 3        3      Review of Systems  10 systems reviewed and all are negative for acute change except as noted in the HPI.  Allergies  Demerol  Home Medications   Prior to Admission medications   Medication Sig Start Date End Date Taking? Authorizing Provider  cetirizine (ZYRTEC) 10 MG tablet Take 1 tablet (10 mg total) by mouth daily. Patient not taking: Reported on 01/25/2015 03/08/14   Wallis BambergMario Mani, PA-C   hydrOXYzine (ATARAX/VISTARIL) 25 MG tablet Take 1 tablet (25 mg total) by mouth every 8 (eight) hours as needed for itching. Patient not taking: Reported on 01/25/2015 07/31/14   Collie SiadStephanie D English, PA  meloxicam (MOBIC) 15 MG tablet Take 1 tablet (15 mg total) by mouth daily. Patient not taking: Reported on 01/25/2015 07/31/14   Collie SiadStephanie D English, PA  metoCLOPramide (REGLAN) 10 MG tablet Take 1 tablet (10 mg total) by mouth every 6 (six) hours as needed for nausea (or headache). 01/25/15   Samuel JesterKathleen McManus, DO  nortriptyline (PAMELOR) 25 MG capsule Take 2 capsules at bedtime for sleep. Patient not taking: Reported on 01/25/2015 07/05/14   Maurice MarchBarbara B McPherson, MD  propranolol (INDERAL) 40 MG tablet Take 1 tablet (40 mg total) by mouth 2 (two) times daily. Patient not taking: Reported on 01/25/2015 07/05/14   Maurice MarchBarbara B McPherson, MD  triamcinolone cream (KENALOG) 0.1 % Apply 1 application topically 2 (two) times daily. Patient not taking: Reported on 01/25/2015 07/31/14   Judeth CornfieldStephanie D English, PA   BP 130/94 mmHg  Pulse 94  Temp(Src) 98.1 F (36.7 C) (Oral)  Resp 16  SpO2 98% Physical Exam  Constitutional: She is oriented to person, place, and time. She appears well-developed and well-nourished.  HENT:  Head: Normocephalic and  atraumatic.  Eyes: Conjunctivae are normal.  Cardiovascular: Normal rate.   Pulmonary/Chest: Effort normal.  Musculoskeletal:  Base of the right middle finger with round hard area in palmer aspect of the hand  Nml ROM and 5/5 strength in the right hand   Neurological: She is alert and oriented to person, place, and time.  Skin: Skin is warm and dry.  Psychiatric: She has a normal mood and affect.  Nursing note and vitals reviewed.   ED Course  Procedures   DIAGNOSTIC STUDIES:  Oxygen Saturation is 98% on RA, normal by my interpretation.    COORDINATION OF CARE:  12:58 PM Will order XR of the hand. Discussed treatment plan with pt at bedside and pt agreed to  plan.   Imaging Review Dg Hand Complete Right  03/09/2015  CLINICAL DATA:  Right hand pain EXAM: RIGHT HAND - COMPLETE 3+ VIEW COMPARISON:  08/24/2013 FINDINGS: Three views of the right hand submitted. No acute fracture or subluxation. No radiopaque foreign body. IMPRESSION: Negative. Electronically Signed   By: Natasha Mead M.D.   On: 03/09/2015 13:55   I have personally reviewed and evaluated these images as part of my medical decision-making.  Patient be referred to orthopedics.  Told to return here as needed.  Patient agrees the plan and all questions were  Charlestine Night, PA-C 03/09/15 [address] PA-C 03/09/15 1412  Mirian Mo, MD 03/15/15 [phone]

## 2015-04-06 ENCOUNTER — Other Ambulatory Visit: Payer: Self-pay | Admitting: Urgent Care

## 2015-06-06 ENCOUNTER — Other Ambulatory Visit: Payer: Self-pay | Admitting: Physician Assistant

## 2015-06-06 DIAGNOSIS — Z1231 Encounter for screening mammogram for malignant neoplasm of breast: Secondary | ICD-10-CM

## 2015-06-11 ENCOUNTER — Other Ambulatory Visit: Payer: Self-pay

## 2015-06-11 DIAGNOSIS — Z1231 Encounter for screening mammogram for malignant neoplasm of breast: Secondary | ICD-10-CM

## 2015-07-02 ENCOUNTER — Ambulatory Visit
Admission: RE | Admit: 2015-07-02 | Discharge: 2015-07-02 | Disposition: A | Discharge status: Home / Self Care | Payer: PRIVATE HEALTH INSURANCE | Source: Ambulatory Visit | Attending: Physician Assistant | Admitting: Physician Assistant

## 2015-07-02 DIAGNOSIS — Z1231 Encounter for screening mammogram for malignant neoplasm of breast: Secondary | ICD-10-CM

## 2015-07-04 ENCOUNTER — Other Ambulatory Visit: Payer: Self-pay | Admitting: Physician Assistant

## 2015-07-04 DIAGNOSIS — R928 Other abnormal and inconclusive findings on diagnostic imaging of breast: Secondary | ICD-10-CM

## 2015-07-11 ENCOUNTER — Ambulatory Visit
Admission: RE | Admit: 2015-07-11 | Discharge: 2015-07-11 | Disposition: A | Discharge status: Home / Self Care | Payer: PRIVATE HEALTH INSURANCE | Source: Ambulatory Visit | Attending: Physician Assistant | Admitting: Physician Assistant

## 2015-07-11 DIAGNOSIS — R928 Other abnormal and inconclusive findings on diagnostic imaging of breast: Secondary | ICD-10-CM

## 2015-07-16 ENCOUNTER — Institutional Professional Consult (permissible substitution): Payer: PRIVATE HEALTH INSURANCE | Admitting: Neurology

## 2015-07-16 ENCOUNTER — Telehealth: Payer: Self-pay

## 2015-07-16 NOTE — Telephone Encounter (Signed)
Pt did not show for their appt with Dr. Dohmeier today.  

## 2015-07-17 ENCOUNTER — Encounter: Payer: Self-pay | Admitting: Neurology

## 2016-02-29 ENCOUNTER — Other Ambulatory Visit: Payer: Self-pay | Admitting: Physician Assistant

## 2016-02-29 DIAGNOSIS — N6310 Unspecified lump in the right breast, unspecified quadrant: Secondary | ICD-10-CM

## 2016-03-07 ENCOUNTER — Ambulatory Visit
Admission: RE | Admit: 2016-03-07 | Discharge: 2016-03-07 | Disposition: A | Discharge status: Home / Self Care | Payer: PRIVATE HEALTH INSURANCE | Source: Ambulatory Visit | Attending: Physician Assistant | Admitting: Physician Assistant

## 2016-03-07 DIAGNOSIS — N6310 Unspecified lump in the right breast, unspecified quadrant: Secondary | ICD-10-CM

## 2017-07-15 ENCOUNTER — Encounter: Payer: Self-pay | Admitting: Internal Medicine

## 2017-08-25 ENCOUNTER — Ambulatory Visit (INDEPENDENT_AMBULATORY_CARE_PROVIDER_SITE_OTHER): Payer: 59 | Admitting: Internal Medicine

## 2017-08-25 ENCOUNTER — Encounter: Payer: Self-pay | Admitting: Internal Medicine

## 2017-08-25 VITALS — BP 120/78 | HR 84 | Ht 64.0 in | Wt 247.0 lb

## 2017-08-25 DIAGNOSIS — K219 Gastro-esophageal reflux disease without esophagitis: Secondary | ICD-10-CM

## 2017-08-25 DIAGNOSIS — Z1211 Encounter for screening for malignant neoplasm of colon: Secondary | ICD-10-CM

## 2017-08-25 MED ORDER — OMEPRAZOLE 40 MG PO CPDR: 40.0000 mg | DELAYED_RELEASE_CAPSULE | Freq: Every day | ORAL | 6 refills | Status: DC

## 2017-08-25 MED ORDER — PEG-KCL-NACL-NASULF-NA ASC-C 140 G PO SOLR: 1.0000 | Freq: Once | ORAL | 0 refills | Status: AC

## 2017-08-25 NOTE — Patient Instructions (Signed)
We have sent the following medications to your pharmacy for you to pick up at your convenience:  Omeprazole  You have been scheduled for an endoscopy and colonoscopy. Please follow the written instructions given to you at your visit today. Please pick up your prep supplies at the pharmacy within the next 1-3 days. If you use inhalers (even only as needed), please bring them with you on the day of your procedure. Your physician has requested that you go to www.[email or web address] and enter the access code given to you at your visit today. This web site gives a general overview about your procedure. However, you should still follow specific instructions given to you by our office regarding your preparation for the procedure.   

## 2017-08-28 ENCOUNTER — Encounter: Payer: Self-pay | Admitting: Internal Medicine

## 2017-08-28 NOTE — Progress Notes (Signed)
HISTORY OF PRESENT ILLNESS:  Debra Welch is a pleasant 52 y.o. 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 The patient reports classic reflux symptoms for over a year. Over time the symptoms have progressed. She describes several severe nocturnal episodes with regurgitation into the mouth after having had fallen asleep. Coughing and choking associated with this. She denies dysphagia. There is bloating and belching. She did initiate ranitidine 150 mg daily. She stopped smoking in February. Reports 10 pound weight gain over the past few months. No regular exercise. Reports regular bowel movements daily. No bleeding. History of colon cancer in second-degree relatives. Patient has not had prior GI evaluations. Review of outside laboratories in radiology. Hemoglobin from March 2016 was normal at 13.7. Mammograms and other non-GI radiology noted.  REVIEW OF SYSTEMS:  All non-GI ROS negative unless otherwise stated in the history of present illness except for seasonal allergies, migraine headaches  Past Medical History:  Diagnosis Date  . Allergy    seasonal  . Bursitis   . GERD (gastroesophageal reflux disease)   . Hypertriglyceridemia   . Migraines   . Vitamin D deficiency     Past Surgical History:  Procedure Laterality Date  . ABDOMINAL HYSTERECTOMY      Social History Debra Linkntonia B Menees  reports that she quit smoking about 2 years ago. She smoked 0.00 packs per day for 24.00 years. She has never used smokeless tobacco. She reports that she does not drink alcohol or use drugs.  family history includes Asthma in her maternal grandmother, mother, and sister; Colon cancer in her cousin and maternal aunt; Colon polyps in her mother; Hypertension in her father; Lupus in her mother; Migraines in her daughter.  Allergies  Allergen Reactions  . Demerol  [Meperidine] Anaphylaxis and Hives       PHYSICAL EXAMINATION: Vital signs: BP 120/78   Pulse 84   Ht 5\' 4"  (1.626 m)   Wt 247 lb (112 kg)   BMI 42.40 kg/m   Constitutional: generally well-appearing, no acute distress Psychiatric: alert and oriented x3, cooperative Eyes: extraocular movements intact, anicteric, conjunctiva pink Mouth: oral pharynx moist, no lesions Neck: supple no lymphadenopathy Cardiovascular: heart regular rate and rhythm, no murmur Lungs: clear to auscultation bilaterally Abdomen: soft,obese, nontender, nondistended, no obvious ascites, no peritoneal signs, normal bowel sounds, no organomegaly. No succussion splash Rectal:deferred until colonoscopy Extremities: no clubbing, cyanosis, or lower extremity edema bilaterally Skin: no lesions on visible extremities Neuro: No focal deficits. Cranial nerves intact  ASSESSMENT:  #1. Chronic GERD. Severe nocturnal component as described. #2. Morbid obesity. Current BMI 42.4. Currently exacerbating GERD #3. Colon cancer screening. Baseline risk. Appropriate candidate   PLAN:  #1. Reflux precautions with attention to weight loss, meal size, and avoiding meals near Bedtime #2. Prescribe omeprazole 40 mg daily #3. Diagnostic upper endoscopy. The patient is higher than baseline risk due to body habitus.The nature of the procedure, as well as the risks, benefits, and alternatives were carefully and thoroughly reviewed with the patient. Ample time for discussion and questions allowed. The patient understood, was satisfied, and agreed to proceed. #4. Screening colonoscopy.The nature of the procedure, as well as the risks, benefits, and alternatives were carefully and thoroughly reviewed with the patient. Ample time for discussion and questions allowed. The patient understood, was satisfied, and agreed to proceed. #5. Exercise, decreased caloric  intake and weight loss  A copy of this consultation note has been sent to Mr.  Jacqulyn Bath

## 2017-10-12 ENCOUNTER — Encounter: Payer: Self-pay | Admitting: Internal Medicine

## 2017-10-21 ENCOUNTER — Other Ambulatory Visit: Payer: Self-pay

## 2017-10-21 ENCOUNTER — Encounter: Payer: Self-pay | Admitting: Internal Medicine

## 2017-10-21 ENCOUNTER — Ambulatory Visit (AMBULATORY_SURGERY_CENTER): Payer: 59 | Admitting: Internal Medicine

## 2017-10-21 VITALS — BP 120/81 | HR 91 | Temp 98.6°F | Resp 18 | Ht 64.0 in | Wt 247.0 lb

## 2017-10-21 DIAGNOSIS — K219 Gastro-esophageal reflux disease without esophagitis: Secondary | ICD-10-CM

## 2017-10-21 DIAGNOSIS — Z1211 Encounter for screening for malignant neoplasm of colon: Secondary | ICD-10-CM | POA: Diagnosis not present

## 2017-10-21 MED ORDER — SODIUM CHLORIDE 0.9 % IV SOLN: 500.0000 mL | Freq: Once | INTRAVENOUS | Status: AC

## 2017-10-21 NOTE — Op Note (Signed)
[facility] Patient Name: Debra Welch Procedure Date: 10/21/2017 2:53 PM MRN: [id number] Endoscopist: Wilhemina Bonito. Marina Goodell , MD Age: 52 Referring MD:  Date of Birth: 1966-04-17 Gender: Female Account #: [id number] Procedure:                Upper GI endoscopy Indications:              Esophageal reflux Medicines:                Monitored Anesthesia Care Procedure:                Pre-Anesthesia Assessment:                           - Prior to the procedure, a History and Physical                            was performed, and patient medications and                            allergies were reviewed. The patient's tolerance of                            previous anesthesia was also reviewed. The risks                            and benefits of the procedure and the sedation                            options and risks were discussed with the patient.                            All questions were answered, and informed consent                            was obtained. Prior Anticoagulants: The patient has                            taken no previous anticoagulant or antiplatelet                            agents. ASA Grade Assessment: II - A patient with                            mild systemic disease. After reviewing the risks                            and benefits, the patient was deemed in                            satisfactory condition to undergo the procedure.                           After obtaining informed consent, the endoscope was  passed under direct vision. Throughout the                            procedure, the patient's blood pressure, pulse, and                            oxygen saturations were monitored continuously. The                            Endoscope was introduced through the mouth, and                            advanced to the second part of duodenum. The upper                            GI endoscopy was accomplished without  difficulty.                            The patient tolerated the procedure well. Scope In: Scope Out: Findings:                 The esophagus was normal.                           The stomach was norma. Small hiatal hernial.                           The examined duodenum was normal.                           The cardia and gastric fundus were normal on                            retroflexion. Complications:            No immediate complications. Estimated Blood Loss:     Estimated blood loss: none. Impression:               - Normal EGD                           - GERD. Recommendation:           - Patient has a contact number available for                            emergencies. The signs and symptoms of potential                            delayed complications were discussed with the                            patient. Return to normal activities tomorrow.                            Written discharge instructions were provided to the  patient.                           - Resume previous diet. Reflux precautions.                           - Continue present medications.                           - Return to the care of your primary provider Wilhemina BonitoJohn N. Marina GoodellPerry, MD 10/21/2017 3:25:21 PM This report has been signed electronically.

## 2017-10-21 NOTE — Op Note (Signed)
[facility] Patient Name: Debra Welch Procedure Date: 10/21/2017 2:54 PM MRN: [id number] Endoscopist: Wilhemina Bonito. Marina Goodell , MD Age: 52 Referring MD:  Date of Birth: Dec 24, 1965 Gender: Female Account #: [id number] Procedure:                Colonoscopy Indications:              Screening for colorectal malignant neoplasm Medicines:                Monitored Anesthesia Care Procedure:                Pre-Anesthesia Assessment:                           - Prior to the procedure, a History and Physical                            was performed, and patient medications and                            allergies were reviewed. The patient's tolerance of                            previous anesthesia was also reviewed. The risks                            and benefits of the procedure and the sedation                            options and risks were discussed with the patient.                            All questions were answered, and informed consent                            was obtained. Prior Anticoagulants: The patient has                            taken no previous anticoagulant or antiplatelet                            agents. ASA Grade Assessment: II - A patient with                            mild systemic disease. After reviewing the risks                            and benefits, the patient was deemed in                            satisfactory condition to undergo the procedure.                           After obtaining informed consent, the colonoscope  was passed under direct vision. Throughout the                            procedure, the patient's blood pressure, pulse, and                            oxygen saturations were monitored continuously. The                            Colonoscope was introduced through the anus and                            advanced to the the cecum, identified by                            appendiceal orifice and  ileocecal valve. The                            ileocecal valve, appendiceal orifice, and rectum                            were photographed. The quality of the bowel                            preparation was excellent. The colonoscopy was                            performed without difficulty. The patient tolerated                            the procedure well. The bowel preparation used was                            SUPREP. Scope In: 2:59:25 PM Scope Out: 3:12:01 PM Scope Withdrawal Time: 0 hours 10 minutes 17 seconds  Total Procedure Duration: 0 hours 12 minutes 36 seconds  Findings:                 Multiple medium-mouthed diverticula were found in                            the ascending colon and left colon.                           The exam was otherwise without abnormality on                            direct and retroflexion views. Complications:            No immediate complications. Estimated blood loss:                            None. Estimated Blood Loss:     Estimated blood loss: none. Impression:               - Diverticulosis in the ascending colon and in  the                            left colon.                           - The examination was otherwise normal on direct                            and retroflexion views.                           - No specimens collected. Recommendation:           - Repeat colonoscopy in 10 years for screening                            purposes.                           - Patient has a contact number available for                            emergencies. The signs and symptoms of potential                            delayed complications were discussed with the                            patient. Return to normal activities tomorrow.                            Written discharge instructions were provided to the                            patient.                           - Resume previous diet.                           -  Continue present medications. Wilhemina Bonito. Marina Goodell, MD 10/21/2017 3:19:20 PM This report has been signed electronically.

## 2017-10-21 NOTE — Patient Instructions (Signed)
HANDOUT GIVEN FOR DIVERTICULOSIS  YOU HAD AN ENDOSCOPIC PROCEDURE TODAY AT THE [facility]:   Refer to the procedure report that was given to you for any specific questions about what was found during the examination.  If the procedure report does not answer your questions, please call your gastroenterologist to clarify.  If you requested that your care partner not be given the details of your procedure findings, then the procedure report has been included in a sealed envelope for you to review at your convenience later.  YOU SHOULD EXPECT: Some feelings of bloating in the abdomen. Passage of more gas than usual.  Walking can help get rid of the air that was put into your GI tract during the procedure and reduce the bloating. If you had a lower endoscopy (such as a colonoscopy or flexible sigmoidoscopy) you may notice spotting of blood in your stool or on the toilet paper. If you underwent a bowel prep for your procedure, you may not have a normal bowel movement for a few days.  Please Note:  You might notice some irritation and congestion in your nose or some drainage.  This is from the oxygen used during your procedure.  There is no need for concern and it should clear up in a day or so.  SYMPTOMS TO REPORT IMMEDIATELY:   Following lower endoscopy (colonoscopy or flexible sigmoidoscopy):  Excessive amounts of blood in the stool  Significant tenderness or worsening of abdominal pains  Swelling of the abdomen that is new, acute  Fever of 100F or higher   Following upper endoscopy (EGD)  Vomiting of blood or coffee ground material  New chest pain or pain under the shoulder blades  Painful or persistently difficult swallowing  New shortness of breath  Fever of 100F or higher  Black, tarry-looking stools  For urgent or emergent issues, a gastroenterologist can be reached at any hour by calling [phone].   DIET:  We do recommend a small meal at first, but then you may  proceed to your regular diet.  Drink plenty of fluids but you should avoid alcoholic beverages for 24 hours.  ACTIVITY:  You should plan to take it easy for the rest of today and you should NOT DRIVE or use heavy machinery until tomorrow (because of the sedation medicines used during the test).    FOLLOW UP: Our staff will call the number listed on your records the next business day following your procedure to check on you and address any questions or concerns that you may have regarding the information given to you following your procedure. If we do not reach you, we will leave a message.  However, if you are feeling well and you are not experiencing any problems, there is no need to return our call.  We will assume that you have returned to your regular daily activities without incident.  If any biopsies were taken you will be contacted by phone or by letter within the next 1-3 weeks.  Please call us at [phone] if you have not heard about the biopsies in 3 weeks.    SIGNATURES/CONFIDENTIALITY: You and/or your care partner have signed paperwork which will be entered into your electronic medical record.  These signatures attest to the fact that that the information above on your After Visit Summary has been reviewed and is understood.  Full responsibility of the confidentiality of this discharge information lies with you and/or your care-partner.

## 2017-10-21 NOTE — Progress Notes (Signed)
A and O x3. Report to RN. Tolerated MAC anesthesia well.Teeth unchanged after procedure.

## 2017-10-22 ENCOUNTER — Telehealth: Payer: Self-pay

## 2017-10-22 NOTE — Telephone Encounter (Signed)
  Follow up Call-  Call back number 10/21/2017  Post procedure Call Back phone  # [phone]  Permission to leave phone message No  comments no answering machine set up  Some recent data might be hidden     Patient questions:  Do you have a fever, pain , or abdominal swelling? No. Pain Score  0 *  Have you tolerated food without any problems? Yes.    Have you been able to return to your normal activities? Yes.    Do you have any questions about your discharge instructions: Diet   No. Medications  No. Follow up visit  No.  Do you have questions or concerns about your Care? No.  Actions: * If pain score is 4 or above: No action needed, pain <4.

## 2017-10-22 NOTE — Telephone Encounter (Signed)
Left message

## 2018-01-28 NOTE — Telephone Encounter (Signed)
Opened in error

## 2018-02-18 ENCOUNTER — Other Ambulatory Visit: Payer: Self-pay | Admitting: Internal Medicine

## 2018-03-01 ENCOUNTER — Emergency Department (HOSPITAL_COMMUNITY)
Admission: EM | Admit: 2018-03-01 | Discharge: 2018-03-02 | Disposition: A | Discharge status: Home / Self Care | Payer: 59 | Attending: Emergency Medicine | Admitting: Emergency Medicine

## 2018-03-01 ENCOUNTER — Encounter (HOSPITAL_COMMUNITY): Payer: Self-pay | Admitting: Emergency Medicine

## 2018-03-01 DIAGNOSIS — G43001 Migraine without aura, not intractable, with status migrainosus: Secondary | ICD-10-CM

## 2018-03-01 DIAGNOSIS — I10 Essential (primary) hypertension: Secondary | ICD-10-CM | POA: Insufficient documentation

## 2018-03-01 DIAGNOSIS — Z87891 Personal history of nicotine dependence: Secondary | ICD-10-CM | POA: Insufficient documentation

## 2018-03-01 DIAGNOSIS — Z79899 Other long term (current) drug therapy: Secondary | ICD-10-CM | POA: Insufficient documentation

## 2018-03-01 MED ORDER — SODIUM CHLORIDE 0.9 % IV BOLUS
1000.0000 mL | Freq: Once | INTRAVENOUS | Status: AC
  Administered 2018-03-01: 1000 mL via INTRAVENOUS

## 2018-03-01 MED ORDER — DIPHENHYDRAMINE HCL 50 MG/ML IJ SOLN
25.0000 mg | Freq: Once | INTRAMUSCULAR | Status: AC
  Administered 2018-03-01: 25 mg via INTRAVENOUS
  Filled 2018-03-01: qty 1

## 2018-03-01 MED ORDER — DEXAMETHASONE SODIUM PHOSPHATE 10 MG/ML IJ SOLN
10.0000 mg | Freq: Once | INTRAMUSCULAR | Status: AC
  Administered 2018-03-01: 10 mg via INTRAVENOUS
  Filled 2018-03-01: qty 1

## 2018-03-01 MED ORDER — METOCLOPRAMIDE HCL 5 MG/ML IJ SOLN
10.0000 mg | Freq: Once | INTRAMUSCULAR | Status: AC
  Administered 2018-03-01: 10 mg via INTRAVENOUS
  Filled 2018-03-01: qty 2

## 2018-03-01 NOTE — ED Provider Notes (Signed)
[facility]-EMERGENCY DEPT Provider Note   CSN: [id number] Arrival date & time: 03/01/18  1717     History   Chief Complaint Chief Complaint  Patient presents with  . Headache    HPI Debra Welch is a 52 y.o. female.  The history is provided by the patient and medical records.  Headache   Associated symptoms include nausea.     52 y.o. F with hx of seasonal allergies, anemia, arthritis, GERD, HTN, HLP, migraine headaches, vitamin D deficiency, presenting to the ED with headache.  States headache woke her from sleep this morning around 0500.  States she did not feel very well but went downstairs and ate some waffles thinking it would help, took some ibuprofen, but had to go back to bed.  States she did doze back off for a little while, but got back up for work.  States she got in her car and felt very dizzy and had "kaleidoscope vision" in her left eye.  States this typically happens with her migraines.  States she knew it was not safe to drive so she went back in the house and tried to lay down.  She was able to does on and off during the day but has not gotten any significant relief of her headache.  States at that point she decided to come in.  Headache remains generalized, throbbing in nature.  She reports associated photophobia and nausea.  No vomiting denies any current dizziness.  She is not currently on any type of anticoagulation.  Denies any falls or head trauma.  Past Medical History:  Diagnosis Date  . Allergy    seasonal  . Anemia   . Arthritis   . Bursitis   . GERD (gastroesophageal reflux disease)   . Hypertension   . Hypertriglyceridemia   . Migraines   . Sleep apnea    questionable  . Vitamin D deficiency     Patient Active Problem List   Diagnosis Date Noted  . Headache, variant migraine 06/06/2014  . Tobacco use disorder 06/06/2014  . Sleep disturbance 06/06/2014  . Obesity 06/06/2014    Past Surgical History:  Procedure  Laterality Date  . ABDOMINAL HYSTERECTOMY       OB History    Gravida  3   Para  3   Term      Preterm      AB      Living  3     SAB      TAB      Ectopic      Multiple      Live Births               Home Medications    Prior to Admission medications   Medication Sig Start Date End Date Taking? Authorizing Provider  acetaminophen (TYLENOL) 500 MG tablet Take 1,000 mg by mouth daily as needed (pain).   Yes [provider]  amLODipine (NORVASC) 5 MG tablet Take 5 mg by mouth daily.  06/15/17  Yes [provider]  cetirizine (ZYRTEC) 10 MG tablet TAKE 1 TABLET BY MOUTH EVERY DAY  "NO MORE REFILLS WITHOUT OFFICE VISIT" 04/06/15  Yes Deatra James, MD  Multiple Vitamins-Minerals (EMERGEN-C IMMUNE PO) Take 1 packet by mouth daily.   Yes [provider]  rosuvastatin (CRESTOR) 10 MG tablet Take 10 mg by mouth daily.  06/15/17  Yes [provider]  omeprazole (PRILOSEC) 40 MG capsule TAKE 1 CAPSULE BY MOUTH EVERY  DAY Patient not taking: Reported on 03/01/2018 02/18/18   Hilarie Fredrickson, MD    Family History Family History  Problem Relation Age of Onset  . Lupus Mother   . Asthma Mother   . Colon polyps Mother   . Hypertension Father        Deceased at age 25  . Asthma Sister   . Asthma Maternal Grandmother   . Migraines Daughter   . Colon cancer Maternal Aunt   . Colon cancer Cousin     Social History Social History   Tobacco Use  . Smoking status: Former Smoker    Packs/day: 0.00    Years: 24.00    Pack years: 0.00    Last attempt to quit: 06/19/2017    Years since quitting: 0.6  . Smokeless tobacco: Never Used  Substance Use Topics  . Alcohol use: No  . Drug use: No     Allergies   Demerol [meperidine]   Review of Systems Review of Systems  Eyes: Positive for photophobia.  Gastrointestinal: Positive for nausea.  Neurological: Positive for headaches.  All other systems reviewed and are  negative.    Physical Exam Updated Vital Signs BP (!) 158/106 (BP Location: Left Arm)   Pulse (!) 103   Temp 98.2 F (36.8 C) (Oral)   Resp 18   SpO2 96%   Physical Exam  Constitutional: She is oriented to person, place, and time. She appears well-developed and well-nourished. No distress.  Watching TV, NAD, requesting to eat  HENT:  Head: Normocephalic and atraumatic.  Right Ear: External ear normal.  Left Ear: External ear normal.  Eyes: Pupils are equal, round, and reactive to light. Conjunctivae and EOM are normal.  Neck: Normal range of motion and full passive range of motion without pain. Neck supple. No neck rigidity.  No rigidity, no meningismus  Cardiovascular: Normal rate, regular rhythm and normal heart sounds.  No murmur heard. Pulmonary/Chest: Effort normal and breath sounds normal. No respiratory distress. She has no wheezes. She has no rhonchi.  Abdominal: Soft. Bowel sounds are normal. There is no tenderness. There is no guarding.  Musculoskeletal: Normal range of motion. She exhibits no edema.  Neurological: She is alert and oriented to person, place, and time. She has normal strength. She displays no tremor. No cranial nerve deficit or sensory deficit. She displays no seizure activity.  AAOx3, answering questions and following commands appropriately; equal strength UE and LE bilaterally; CN grossly intact; moves all extremities appropriately without ataxia; no focal neuro deficits or facial asymmetry appreciated  Skin: Skin is warm and dry. No rash noted. She is not diaphoretic.  Psychiatric: She has a normal mood and affect. Her behavior is normal. Thought content normal.  Nursing note and vitals reviewed.    ED Treatments / Results  Labs (all labs ordered are listed, but only abnormal results are displayed) Labs Reviewed - No data to display  EKG None  Radiology No results found.  Procedures Procedures (including critical care time)  Medications  Ordered in ED Medications  sodium chloride 0.9 % bolus 1,000 mL (0 mLs Intravenous Stopped 03/01/18 2315)  diphenhydrAMINE (BENADRYL) injection 25 mg (25 mg Intravenous Given 03/01/18 2300)  metoCLOPramide (REGLAN) injection 10 mg (10 mg Intravenous Given 03/01/18 2300)  dexamethasone (DECADRON) injection 10 mg (10 mg Intravenous Given 03/01/18 2300)  ketorolac (TORADOL) 30 MG/ML injection 30 mg (30 mg Intravenous Given 03/02/18 0053)  prochlorperazine (COMPAZINE) injection 10 mg (10 mg Intravenous Given 03/02/18 0053)  Initial Impression / Assessment and Plan / ED Course  I have reviewed the triage vital signs and the nursing notes.  Pertinent labs & imaging results that were available during my care of the patient were reviewed by me and considered in my medical decision making (see chart for details).  52 year old female here with headache that woke her from sleep this morning.  Has been ongoing pretty much all day.  Reports "kaleidoscope" vision in her left eye earlier today which is typical during her migraines.  Had some dizziness earlier but resolved at this time.  She is awake, alert, appropriately oriented.  Neurologic exam is nonfocal.  No signs or symptoms suggestive of meningitis.  She is requesting to eat and drink.  Suspect this is migraine.  Will give migraine cocktail and reassess.  Of note, BP is elevated.  She does have history of hypertension reports she has been compliant with her medications.  Suspect this may be related to pain.  Will monitor as headache improved.  After initial meds, patient BP has improved.  She appears to be resting.  Once awoken, states she still has headache.  Will give second round of meds.  Feeling better after toradol and compazine, states she is ready to go.  Her BP remains controlled, suspect elevation due to pain.  Doubt acute endorgan damage.  Will have her continue supportive care at home, close follow-up with PCP.  She will return here for  any new/acute changes.  Final Clinical Impressions(s) / ED Diagnoses   Final diagnoses:  Migraine without aura and with status migrainosus, not intractable    ED Discharge Orders    None       Garlon Hatchet, PA-C 03/02/18 0202    Maia Plan, MD 03/02/18 [phone]

## 2018-03-01 NOTE — ED Triage Notes (Signed)
Pt BIB POV. Pt c/o HA that started this am around 0500. It woke her up from sleep. Light sensitivity and nausea are present. Pt reports she was dizzy this am when she woke up but is not currently dizzy. Ambulatory. Pt reports that her vision is blurry.

## 2018-03-02 MED ORDER — PROCHLORPERAZINE EDISYLATE 10 MG/2ML IJ SOLN
10.0000 mg | Freq: Once | INTRAMUSCULAR | Status: AC
  Administered 2018-03-02: 10 mg via INTRAVENOUS
  Filled 2018-03-02: qty 2

## 2018-03-02 MED ORDER — KETOROLAC TROMETHAMINE 30 MG/ML IJ SOLN
30.0000 mg | Freq: Once | INTRAMUSCULAR | Status: AC
  Administered 2018-03-02: 30 mg via INTRAVENOUS
  Filled 2018-03-02: qty 1

## 2018-03-02 NOTE — ED Notes (Signed)
ED Provider at bedside. 

## 2018-03-02 NOTE — Discharge Instructions (Signed)
Make sure to get some rest tonight.  Can continue tylenol/motrin as needed.  Can try excedrin should bad headache recur. Follow-up with your primary care doctor. Return here for any new/acute changes.

## 2018-04-29 ENCOUNTER — Encounter: Payer: Self-pay | Admitting: Neurology

## 2018-04-29 ENCOUNTER — Ambulatory Visit (INDEPENDENT_AMBULATORY_CARE_PROVIDER_SITE_OTHER): Payer: BLUE CROSS/BLUE SHIELD | Admitting: Neurology

## 2018-04-29 VITALS — BP 132/85 | HR 100 | Ht 64.0 in | Wt 243.0 lb

## 2018-04-29 DIAGNOSIS — R0683 Snoring: Secondary | ICD-10-CM

## 2018-04-29 DIAGNOSIS — R351 Nocturia: Secondary | ICD-10-CM

## 2018-04-29 DIAGNOSIS — G4719 Other hypersomnia: Secondary | ICD-10-CM

## 2018-04-29 DIAGNOSIS — R0681 Apnea, not elsewhere classified: Secondary | ICD-10-CM

## 2018-04-29 DIAGNOSIS — Z6841 Body Mass Index (BMI) 40.0 and over, adult: Secondary | ICD-10-CM

## 2018-04-29 DIAGNOSIS — F17200 Nicotine dependence, unspecified, uncomplicated: Secondary | ICD-10-CM

## 2018-04-29 DIAGNOSIS — R519 Headache, unspecified: Secondary | ICD-10-CM

## 2018-04-29 DIAGNOSIS — Z9189 Other specified personal risk factors, not elsewhere classified: Secondary | ICD-10-CM | POA: Diagnosis not present

## 2018-04-29 DIAGNOSIS — R51 Headache: Secondary | ICD-10-CM

## 2018-04-29 NOTE — Patient Instructions (Signed)

## 2018-04-29 NOTE — Progress Notes (Signed)
Subjective:    Patient ID: Debra Welch is a 52 y.o. female.  HPI     Huston Foley, MD, PhD [facility] [address] [address] [place], [place] [address]  Dear Debra Welch,   I saw your patient, Debra Welch, upon your kind request in my sleep clinic today for initial consultation of her sleep disorder, in particular, concern for underlying obstructive sleep apnea. The patient is unaccompanied today. As you know, Debra Welch is a 52 year old right-handed woman with an underlying medical history of hypertriglyceridemia, hypertension, reflux disease, anemia, allergies, arthritis, vitamin D deficiency, migraine headaches and morbid obesity with BMI of over 40, who reports snoring and excessive daytime somnolence. I reviewed your office note from 03/09/2018, which you kindly included. Her Epworth sleepiness score is 16 out of 24 today, fatigue score is 43 out of 63. She reports breathing pauses while asleep and nonrestorative sleep. She wakes up with a sense of gasping for air. She lives with her fianc, she has 3 children, works for [employer]. She smokes half pack per day and drinks caffeine in the form of coffee, one cup per day on average. She goes to bed between 8:30 and 9 and rise time is between 6 and 6:30. She watches TV in the bedroom and has a sleep timer on the TV. She lives with her fianc and stepdaughter, she has 3 grown children of her own. They have a dog in the household but not in the bedroom. She has had recurrent strep throat and ear infections, had a year to placement in the past but no tonsillectomy. She is not aware of any family history of OSA. She has been on phentermine for weight loss for the past month. She tried Chantix for smoking cessation but stopped it. She's trying to reduce her smoking on her own. She has nocturia about once or twice per average night and has woken up with a headache at times. She has had witnessed apneas and  also woken herself up with a sense of snorting and gasping.  Her Past Medical History Is Significant For: Past Medical History:  Diagnosis Date  . Allergy    seasonal  . Anemia   . Arthritis   . Bursitis   . GERD (gastroesophageal reflux disease)   . Hypertension   . Hypertriglyceridemia   . Migraines   . Sleep apnea    questionable  . Vitamin D deficiency     Her Past Surgical History Is Significant For: Past Surgical History:  Procedure Laterality Date  . ABDOMINAL HYSTERECTOMY      Her Family History Is Significant For: Family History  Problem Relation Age of Onset  . Lupus Mother   . Asthma Mother   . Colon polyps Mother   . Hypertension Father        Deceased at age 89  . Asthma Sister   . Asthma Maternal Grandmother   . Migraines Daughter   . Colon cancer Maternal Aunt   . Colon cancer Cousin     Her Social History Is Significant For: Social History   Socioeconomic History  . Marital status: Single    Spouse name: Not on file  . Number of children: 3  . Years of education: Not on file  . Highest education level: Not on file  Occupational History  . Not on file  Social Needs  . Financial resource strain: Not on file  . Food insecurity:    Worry:  Not on file    Inability: Not on file  . Transportation needs:    Medical: Not on file    Non-medical: Not on file  Tobacco Use  . Smoking status: Former Smoker    Packs/day: 0.00    Years: 24.00    Pack years: 0.00    Last attempt to quit: 06/19/2017    Years since quitting: 0.8  . Smokeless tobacco: Never Used  Substance and Sexual Activity  . Alcohol use: No  . Drug use: No  . Sexual activity: Yes    Birth control/protection: Surgical  Lifestyle  . Physical activity:    Days per week: Not on file    Minutes per session: Not on file  . Stress: Not on file  Relationships  . Social connections:    Talks on phone: Not on file    Gets together: Not on file    Attends religious service: Not on  file    Active member of club or organization: Not on file    Attends meetings of clubs or organizations: Not on file    Relationship status: Not on file  Other Topics Concern  . Not on file  Social History Narrative   Pt works as a Radio producer; she also has another part-time job. She recently withdrew from [employer] w/ [employer].    Her Allergies Are:  Allergies  Allergen Reactions  . Demerol [Meperidine] Anaphylaxis and Hives  :   Her Current Medications Are:  Outpatient Encounter Medications as of 04/29/2018  Medication Sig  . acetaminophen (TYLENOL) 500 MG tablet Take 1,000 mg by mouth daily as needed (pain).  Marland Kitchen amLODipine (NORVASC) 5 MG tablet Take 5 mg by mouth daily.   . cetirizine (ZYRTEC) 10 MG tablet TAKE 1 TABLET BY MOUTH EVERY DAY  "NO MORE REFILLS WITHOUT OFFICE VISIT"  . Multiple Vitamins-Minerals (EMERGEN-C IMMUNE PO) Take 1 packet by mouth daily.  Marland Kitchen omeprazole (PRILOSEC) 40 MG capsule TAKE 1 CAPSULE BY MOUTH EVERY DAY  . rosuvastatin (CRESTOR) 10 MG tablet Take 10 mg by mouth daily.   . varenicline (CHANTIX PAK) 0.5 MG X 11 & 1 MG X 42 tablet Take by mouth 2 (two) times daily. Take one 0.5 mg tablet by mouth once daily for 3 days, then increase to one 0.5 mg tablet twice daily for 4 days, then increase to one 1 mg tablet twice daily.   Facility-Administered Encounter Medications as of 04/29/2018  Medication  . 0.9 %  sodium chloride infusion  :  Review of Systems:  Out of a complete 14 point review of systems, all are reviewed and negative with the exception of these symptoms as listed below: Review of Systems  Neurological:       Pt presents today with fatigue. Pt states that she snores and she has been told she stops breathing in her sleep. Pt states she falls asleep 8:30 pm-6/6:30 am and still wakes up and is fatigue. Pt denies having a sleep study completed. Epworth Sleepiness Scale 0= would never doze 1= slight chance of dozing 2=  moderate chance of dozing 3= high chance of dozing  Sitting and reading:3 Watching TV:3 Sitting inactive in a public place (ex. Theater or meeting):2 As a passenger in a car for an hour without a break:3 Lying down to rest in the afternoon:3 Sitting and talking to someone:1 Sitting quietly after lunch (no alcohol):2 In a car, while stopped in traffic:0 Total:17      Objective:  Neurological Exam  Physical Exam Physical Examination:   Vitals:   04/29/18 1320  BP: 132/85  Pulse: 100   General Examination: The patient is a very pleasant 52 y.o. female in no acute distress. She appears well-developed and well-nourished and well groomed.   HEENT: Normocephalic, atraumatic, pupils are equal, round and reactive to light and accommodation. Extraocular tracking is good without limitation to gaze excursion or nystagmus noted. Normal smooth pursuit is noted. Hearing is grossly intact. Face is symmetric with normal facial animation and normal facial sensation. Speech is clear with no dysarthria noted. There is no hypophonia. There is no lip, neck/head, jaw or voice tremor. Neck is supple with full range of passive and active motion. There are no carotid bruits on auscultation. Oropharynx exam reveals: mild mouth dryness, adequate dental hygiene and marked airway crowding, due to thicker soft palate, tonsils 2-3+, and smaller airway entry. Mallampati is class III. Tongue protrudes centrally and palate elevates symmetrically. Neck size is 17 5/8 inches. She has a Mild overbite. Nasal inspection reveals no significant nasal mucosal bogginess or redness and no septal deviation.   Chest: Clear to auscultation without wheezing, rhonchi or crackles noted.  Heart: S1+S2+0, regular and normal without murmurs, rubs or gallops noted.   Abdomen: Soft, non-tender and non-distended with normal bowel sounds appreciated on auscultation.  Extremities: There is no pitting edema in the distal lower extremities  bilaterally. Pedal pulses are intact.  Skin: Warm and dry without trophic changes noted. There ar2.  Musculoskeletal: exam reveals no obvious joint deformities, tenderness or joint swelling or erythema.   Neurologically:  Mental status: The patient is awake, alert and oriented in all 4 spheres. Her immediate and remote memory, attention, language skills and fund of knowledge are appropriate. There is no evidence of aphasia, agnosia, apraxia or anomia. Speech is clear with normal prosody and enunciation. Thought process is linear. Mood is normal and affect is normal.  Cranial nerves II - XII are as described above under HEENT exam. In addition: shoulder shrug is normal with equal shoulder height noted. Motor exam: Normal bulk, strength and tone is noted. There is no drift, tremor or rebound. Romberg is negative. Fine motor skills and coordination: grossly intact.  Cerebellar testing: No dysmetria or intention tremor. There is no truncal or gait ataxia.  Sensory exam: intact to light touch in the UEs and LEs.  Gait, station and balance: She stands easily. No veering to one side is noted. No leaning to one side is noted. Posture is age-appropriate and stance is narrow based. Gait shows normal stride length and normal pace. No problems turning are noted. Tandem walk is unremarkable.   Assessment and Plan:  In summary, Rocky Linkntonia B Betley is a very pleasant 52 y.o.-year old female with an underlying medical history of hypertriglyceridemia, hypertension, reflux disease, anemia, allergies, arthritis, vitamin D deficiency, migraine headaches and morbid obesity with BMI of over 40, whose history and physical exam concerning for obstructive sleep apnea (OSA). I had a long chat with the patient about my findings and the diagnosis of OSA, its prognosis and treatment options. We talked about medical treatments, surgical interventions and non-pharmacological approaches. I explained in particular the risks and  ramifications of untreated moderate to severe OSA, especially with respect to developing cardiovascular disease down the Road, including congestive heart failure, difficult to treat hypertension, cardiac arrhythmias, or stroke. Even type 2 diabetes has, in part, been linked to untreated OSA. Symptoms of untreated OSA include daytime sleepiness, memory problems,  mood irritability and mood disorder such as depression and anxiety, lack of energy, as well as recurrent headaches, especially morning headaches. We talked about smoking cessation and trying to maintain a healthy lifestyle in general, as well as the importance of weight control. I encouraged the patient to eat healthy, exercise daily and keep well hydrated, to keep a scheduled bedtime and wake time routine, to not skip any meals and eat healthy snacks in between meals. I advised the patient not to drive when feeling sleepy.I recommended the following at this time: sleep study with potential positive airway pressure titration. (We will score hypopneas at 3%).   I explained the sleep test procedure to the patient and also outlined possible surgical and non-surgical treatment options of OSA, including the use of a custom-made dental device (which would require a referral to a specialist dentist or oral surgeon), upper airway surgical options, such as pillar implants, radiofrequency surgery, tongue base surgery, and UPPP (which would involve a referral to an ENT surgeon). Rarely, jaw surgery such as mandibular advancement may be considered.  I also explained the CPAP treatment option to the patient, who indicated that she would be willing to try CPAP if the need arises. I explained the importance of being compliant with PAP treatment, not only for insurance purposes but primarily to improve Her symptoms, and for the patient's long term health benefit, including to reduce Her cardiovascular risks. I answered all her questions today and the patient was in  agreement. I plan to see her back after the sleep study is completed and encouraged her to call with any interim questions, concerns, problems or updates.   Thank you very much for allowing me to participate in the care of this nice patient. If I can be of any further assistance to you please do not hesitate to call me at [phone].  Sincerely,   Huston Foley, MD, PhD

## 2018-04-29 NOTE — Progress Notes (Deleted)
Subjective:    Patient ID: Debra Welch is a 52 y.o. female.  HPI {Common ambulatory SmartLinks:[id number]}  Review of Systems  Objective:  Neurological Exam  Physical Exam  Assessment:   ***  Plan:   ***

## 2019-03-28 ENCOUNTER — Encounter (HOSPITAL_COMMUNITY): Payer: Self-pay

## 2019-03-28 ENCOUNTER — Other Ambulatory Visit: Payer: Self-pay

## 2019-03-28 ENCOUNTER — Ambulatory Visit (HOSPITAL_COMMUNITY)
Admission: EM | Admit: 2019-03-28 | Discharge: 2019-03-28 | Disposition: A | Discharge status: Home / Self Care | Payer: Self-pay | Attending: Family Medicine | Admitting: Family Medicine

## 2019-03-28 DIAGNOSIS — I10 Essential (primary) hypertension: Secondary | ICD-10-CM

## 2019-03-28 DIAGNOSIS — Z760 Encounter for issue of repeat prescription: Secondary | ICD-10-CM

## 2019-03-28 DIAGNOSIS — G43809 Other migraine, not intractable, without status migrainosus: Secondary | ICD-10-CM

## 2019-03-28 MED ORDER — METOCLOPRAMIDE HCL 5 MG/ML IJ SOLN
5.0000 mg | Freq: Once | INTRAMUSCULAR | Status: AC
  Administered 2019-03-28: 5 mg via INTRAMUSCULAR

## 2019-03-28 MED ORDER — DEXAMETHASONE SODIUM PHOSPHATE 10 MG/ML IJ SOLN
10.0000 mg | Freq: Once | INTRAMUSCULAR | Status: AC
  Administered 2019-03-28: 10 mg via INTRAMUSCULAR

## 2019-03-28 MED ORDER — ROSUVASTATIN CALCIUM 10 MG PO TABS: 10.0000 mg | ORAL_TABLET | Freq: Every day | ORAL | 0 refills | Status: DC

## 2019-03-28 MED ORDER — DEXAMETHASONE SODIUM PHOSPHATE 10 MG/ML IJ SOLN
INTRAMUSCULAR | Status: AC
  Filled 2019-03-28: qty 1

## 2019-03-28 MED ORDER — NAPROXEN 500 MG PO TABS: 500.0000 mg | ORAL_TABLET | Freq: Two times a day (BID) | ORAL | 0 refills | Status: DC | PRN

## 2019-03-28 MED ORDER — METOCLOPRAMIDE HCL 5 MG/ML IJ SOLN
INTRAMUSCULAR | Status: AC
  Filled 2019-03-28: qty 2

## 2019-03-28 MED ORDER — KETOROLAC TROMETHAMINE 60 MG/2ML IM SOLN
60.0000 mg | Freq: Once | INTRAMUSCULAR | Status: AC
  Administered 2019-03-28: 60 mg via INTRAMUSCULAR

## 2019-03-28 MED ORDER — KETOROLAC TROMETHAMINE 60 MG/2ML IM SOLN
INTRAMUSCULAR | Status: AC
  Filled 2019-03-28: qty 2

## 2019-03-28 MED ORDER — AMLODIPINE BESYLATE 5 MG PO TABS: 5.0000 mg | ORAL_TABLET | Freq: Every day | ORAL | 0 refills | Status: DC

## 2019-03-28 NOTE — ED Triage Notes (Signed)
Pt presents for medication refill on blood pressure medication that she has been out of for 2 weeks; pt also has complaints of headache X 2 days.

## 2019-03-28 NOTE — Discharge Instructions (Addendum)
We gave you Toradol, Decadron and Reglan today for your headache, typically begin working in 30-40 minutes May use naprosyn twice daily as needed for further headache management  I refilled your blood pressure and cholesterol medicine, take as prescribed Follow up with primary care- contact info below- for further management and refills  Please follow up in the emergency room if developing worsening headache, vision changes, chest pain, weakness, difficulty speaking, facial drooping

## 2019-03-28 NOTE — ED Provider Notes (Signed)
[facility]    CSN: [id number] Arrival date & time: 03/28/19  4132      History   Chief Complaint Chief Complaint  Patient presents with  . Medication Refill  . Headache    HPI Debra Welch is a 53 y.o. female history of hypertension, hyperlipidemia, tobacco use, migraines presenting today for evaluation of headaches and medication refill.  Patient states that she has been out of her blood pressure medicine for approximately 2 weeks.  Over the past 2 days she is started to develop headaches that have caused her to have very poor sleep.  Right now headaches are located to bilateral temporal areas.  She has had some intermittent blurry vision, denies blackening of vision.  Denies sudden onset of headache.  Denies worst headache of life.  Feels similar to previous migraines.  Tried using Excedrin without relief yesterday.  She denies nausea or vomiting.  Has had some mild photophobia.  Denies weakness, difficulty speaking.  Patient states that she has finals coming up and has had more stress related to this.  She also typically has headaches when her blood pressure elevates as well.  HPI  Past Medical History:  Diagnosis Date  . Allergy    seasonal  . Anemia   . Arthritis   . Bursitis   . GERD (gastroesophageal reflux disease)   . Hypertension   . Hypertriglyceridemia   . Migraines   . Sleep apnea    questionable  . Vitamin D deficiency     Patient Active Problem List   Diagnosis Date Noted  . Headache, variant migraine 06/06/2014  . Tobacco use disorder 06/06/2014  . Sleep disturbance 06/06/2014  . Obesity 06/06/2014    Past Surgical History:  Procedure Laterality Date  . ABDOMINAL HYSTERECTOMY      OB History    Gravida  3   Para  3   Term      Preterm      AB      Living  3     SAB      TAB      Ectopic      Multiple      Live Births               Home Medications    Prior to Admission medications   Medication Sig Start  Date End Date Taking? Authorizing Provider  acetaminophen (TYLENOL) 500 MG tablet Take 1,000 mg by mouth daily as needed (pain).    [provider]  amLODipine (NORVASC) 5 MG tablet Take 1 tablet (5 mg total) by mouth daily. 03/28/19   Nyah Shepherd C, PA-C  cetirizine (ZYRTEC) 10 MG tablet TAKE 1 TABLET BY MOUTH EVERY DAY  "NO MORE REFILLS WITHOUT OFFICE VISIT" 04/06/15   Peterson Lombard, MD  montelukast (SINGULAIR) 10 MG tablet Take 10 mg by mouth at bedtime.    [provider]  Multiple Vitamins-Minerals (EMERGEN-C IMMUNE PO) Take 1 packet by mouth daily.    [provider]  naproxen (NAPROSYN) 500 MG tablet Take 1 tablet (500 mg total) by mouth 2 (two) times daily as needed for headache. 03/28/19   Devanie Galanti C, PA-C  omeprazole (PRILOSEC) 40 MG capsule TAKE 1 CAPSULE BY MOUTH EVERY DAY 02/18/18   Irene Shipper, MD  phentermine 37.5 MG capsule Take 37.5 mg by mouth every morning.    [provider]  rosuvastatin (CRESTOR) 10 MG tablet Take 1 tablet (10 mg total) by mouth  daily. 03/28/19   Rosellen Lichtenberger, Junius CreamerHallie C, PA-C    Family History Family History  Problem Relation Age of Onset  . Lupus Mother   . Asthma Mother   . Colon polyps Mother   . Hypertension Father        Deceased at age 52  . Asthma Sister   . Asthma Maternal Grandmother   . Migraines Daughter   . Colon cancer Maternal Aunt   . Colon cancer Cousin     Social History Social History   Tobacco Use  . Smoking status: Former Smoker    Packs/day: 0.00    Years: 24.00    Pack years: 0.00    Quit date: 06/19/2017    Years since quitting: 1.7  . Smokeless tobacco: Never Used  Substance Use Topics  . Alcohol use: No  . Drug use: No     Allergies   Demerol [meperidine]   Review of Systems Review of Systems  Constitutional: Negative for fatigue and fever.  HENT: Negative for congestion, sinus pressure and sore throat.   Eyes: Positive for photophobia and visual disturbance.  Negative for pain.  Respiratory: Negative for cough and shortness of breath.   Cardiovascular: Negative for chest pain.  Gastrointestinal: Negative for abdominal pain, nausea and vomiting.  Genitourinary: Negative for decreased urine volume and hematuria.  Musculoskeletal: Negative for myalgias, neck pain and neck stiffness.  Neurological: Positive for headaches. Negative for dizziness, syncope, facial asymmetry, speech difficulty, weakness, light-headedness and numbness.     Physical Exam Triage Vital Signs ED Triage Vitals  Enc Vitals Group     BP 03/28/19 0817 (!) 137/92     Pulse Rate 03/28/19 0817 93     Resp 03/28/19 0817 18     Temp 03/28/19 0817 98.5 F (36.9 C)     Temp Source 03/28/19 0817 Oral     SpO2 03/28/19 0817 98 %     Weight --      Height --      Head Circumference --      Peak Flow --      Pain Score 03/28/19 0819 5     Pain Loc --      Pain Edu? --      Excl. in GC? --    No data found.  Updated Vital Signs BP (!) 137/92 (BP Location: Left Arm)   Pulse 93   Temp 98.5 F (36.9 C) (Oral)   Resp 18   SpO2 98%   Visual Acuity Right Eye Distance:   Left Eye Distance:   Bilateral Distance:    Right Eye Near:   Left Eye Near:    Bilateral Near:     Physical Exam Vitals signs and nursing note reviewed.  Constitutional:      General: She is not in acute distress.    Appearance: She is well-developed.  HENT:     Head: Normocephalic and atraumatic.     Ears:     Comments: Bilateral ears without tenderness to palpation of external auricle, tragus and mastoid, EAC's without erythema or swelling, TM's with good bony landmarks and cone of light. Non erythematous.     Mouth/Throat:     Comments: Oral mucosa pink and moist, no tonsillar enlargement or exudate. Posterior pharynx patent and nonerythematous, no uvula deviation or swelling. Normal phonation. Palate elevates symmetrically Eyes:     Extraocular Movements: Extraocular movements intact.      Conjunctiva/sclera: Conjunctivae normal.     Pupils: Pupils are equal,  round, and reactive to light.  Neck:     Musculoskeletal: Neck supple.     Comments: Full active range of motion of neck Cardiovascular:     Rate and Rhythm: Normal rate and regular rhythm.     Heart sounds: No murmur.  Pulmonary:     Effort: Pulmonary effort is normal. No respiratory distress.     Breath sounds: Normal breath sounds.     Comments: Breathing comfortably at rest, CTABL, no wheezing, rales or other adventitious sounds auscultated Abdominal:     Palpations: Abdomen is soft.     Tenderness: There is no abdominal tenderness.  Skin:    General: Skin is warm and dry.  Neurological:     General: No focal deficit present.     Mental Status: She is alert and oriented to person, place, and time. Mental status is at baseline.     Comments: Patient A&O x3, cranial nerves II-XII grossly intact, strength at shoulders, hips and knees 5/5, equal bilaterally, patellar reflex 1+ bilaterally.Gait without abnormality.      UC Treatments / Results  Labs (all labs ordered are listed, but only abnormal results are displayed) Labs Reviewed - No data to display  EKG   Radiology No results found.  Procedures Procedures (including critical care time)  Medications Ordered in UC Medications  ketorolac (TORADOL) injection 60 mg (60 mg Intramuscular Given 03/28/19 0857)  metoCLOPramide (REGLAN) injection 5 mg (5 mg Intramuscular Given 03/28/19 0857)  dexamethasone (DECADRON) injection 10 mg (10 mg Intramuscular Given 03/28/19 0858)  dexamethasone (DECADRON) 10 MG/ML injection (has no administration in time range)  ketorolac (TORADOL) 60 MG/2ML injection (has no administration in time range)  metoCLOPramide (REGLAN) 5 MG/ML injection (has no administration in time range)    Initial Impression / Assessment and Plan / UC Course  I have reviewed the triage vital signs and the nursing notes.  Pertinent labs & imaging  results that were available during my care of the patient were reviewed by me and considered in my medical decision making (see chart for details).     Blood pressure medicine refilled today, provided amlodipine as well as refilled her rosuvastatin.  Discussed following up with primary care, provided primary care contacts.  Blood pressure with diastolic elevation, systolic borderline.  No neuro deficits at this time, no red flags.  Headache seems like typical migraine, will treat as such with migraine cocktail today with Toradol, Decadron and Reglan.  Will provide Naprosyn to use as needed for further headaches at home.  Restart blood pressure medicine.  Continue to monitor,Discussed strict return precautions. Patient verbalized understanding and is agreeable with plan.  Final Clinical Impressions(s) / UC Diagnoses   Final diagnoses:  Other migraine without status migrainosus, not intractable  Encounter for medication refill  Essential hypertension     Discharge Instructions     We gave you Toradol, Decadron and Reglan today for your headache, typically begin working in 30-40 minutes May use naprosyn twice daily as needed for further headache management  I refilled your blood pressure and cholesterol medicine, take as prescribed Follow up with primary care- contact info below- for further management and refills  Please follow up in the emergency room if developing worsening headache, vision changes, chest pain, weakness, difficulty speaking, facial drooping    ED Prescriptions    Medication Sig Dispense Auth. Provider   amLODipine (NORVASC) 5 MG tablet Take 1 tablet (5 mg total) by mouth daily. 90 tablet Temesgen Weightman, [place] C, PA-C  rosuvastatin (CRESTOR) 10 MG tablet Take 1 tablet (10 mg total) by mouth daily. 90 tablet Jsean Taussig C, PA-C   naproxen (NAPROSYN) 500 MG tablet Take 1 tablet (500 mg total) by mouth 2 (two) times daily as needed for headache. 30 tablet Dorlis Judice, [place] C,  PA-C     PDMP not reviewed this encounter.   Lew Dawes, [place] 03/28/19 [phone]

## 2019-12-19 ENCOUNTER — Encounter (HOSPITAL_COMMUNITY): Payer: Self-pay

## 2019-12-19 ENCOUNTER — Ambulatory Visit (HOSPITAL_COMMUNITY)
Admission: EM | Admit: 2019-12-19 | Discharge: 2019-12-19 | Disposition: A | Discharge status: Home / Self Care | Payer: HRSA Program | Attending: Family Medicine | Admitting: Family Medicine

## 2019-12-19 ENCOUNTER — Other Ambulatory Visit: Payer: Self-pay

## 2019-12-19 DIAGNOSIS — Z20822 Contact with and (suspected) exposure to covid-19: Secondary | ICD-10-CM | POA: Diagnosis not present

## 2019-12-19 DIAGNOSIS — J22 Unspecified acute lower respiratory infection: Secondary | ICD-10-CM | POA: Diagnosis not present

## 2019-12-19 LAB — SARS CORONAVIRUS 2 (TAT 6-24 HRS): SARS Coronavirus 2: NEGATIVE

## 2019-12-19 MED ORDER — PREDNISONE 20 MG PO TABS: 40.0000 mg | ORAL_TABLET | Freq: Every day | ORAL | 0 refills | Status: AC

## 2019-12-19 MED ORDER — AZITHROMYCIN 250 MG PO TABS: ORAL_TABLET | ORAL | 0 refills | Status: AC

## 2019-12-19 MED ORDER — ALBUTEROL SULFATE HFA 108 (90 BASE) MCG/ACT IN AERS: 1.0000 | INHALATION_SPRAY | Freq: Four times a day (QID) | RESPIRATORY_TRACT | 0 refills | Status: DC | PRN

## 2019-12-19 NOTE — ED Provider Notes (Signed)
[facility]    CSN: [id number] Arrival date & time: 12/19/19  2423      History   Chief Complaint Chief Complaint  Patient presents with  . Shortness of Breath  . Cough  . Sore Throat  . Headache    HPI Debra Welch is a 54 y.o. female.   Debra Welch presents with complaints of symptoms which started 2 days ago. Started with sore throat. History of strep so initially she thought this is what she was developing. She noted thick yellow mucus production. Now with cough, which is productive. Yellow mucus with cough as well. Throat feels dry and itchy. Maybe a fever last night, she didn't check it. Feels short of breath. Nausea yesterday. No further gi symptoms. Right ear pain. No headache or body aches. Her grandson had a bad cold. No other specific known ill contacts. Works at [employer] half way house. No asthma or copd history. Feels somewhat like bronchitis she has had in the past. Fully covid vaccinated. Taking mucinex, cough drops, theraflu, emergenc-c. Mucinex has helped some   ROS per HPI, negative if not otherwise mentioned.      Past Medical History:  Diagnosis Date  . Allergy    seasonal  . Anemia   . Arthritis   . Bursitis   . GERD (gastroesophageal reflux disease)   . Hypertension   . Hypertriglyceridemia   . Migraines   . Sleep apnea    questionable  . Vitamin D deficiency     Patient Active Problem List   Diagnosis Date Noted  . Headache, variant migraine 06/06/2014  . Tobacco use disorder 06/06/2014  . Sleep disturbance 06/06/2014  . Obesity 06/06/2014    Past Surgical History:  Procedure Laterality Date  . ABDOMINAL HYSTERECTOMY      OB History    Gravida  3   Para  3   Term      Preterm      AB      Living  3     SAB      TAB      Ectopic      Multiple      Live Births               Home Medications    Prior to Admission medications   Medication Sig Start Date End Date Taking? Authorizing  Provider  acetaminophen (TYLENOL) 500 MG tablet Take 1,000 mg by mouth daily as needed (pain).    [provider]  albuterol (PROAIR HFA) 108 (90 Base) MCG/ACT inhaler Inhale 1-2 puffs into the lungs every 6 (six) hours as needed for wheezing or shortness of breath. 12/19/19   Linus Mako B, NP  amLODipine (NORVASC) 5 MG tablet Take 1 tablet (5 mg total) by mouth daily. 03/28/19   Wieters, Hallie C, PA-C  azithromycin (ZITHROMAX) 250 MG tablet Take 2 tablets (500 mg total) by mouth daily for 1 day, THEN 1 tablet (250 mg total) daily for 4 days. 12/19/19 12/24/19  Georgetta Haber, NP  cetirizine (ZYRTEC) 10 MG tablet TAKE 1 TABLET BY MOUTH EVERY DAY  "NO MORE REFILLS WITHOUT OFFICE VISIT" 04/06/15   Deatra James, MD  montelukast (SINGULAIR) 10 MG tablet Take 10 mg by mouth at bedtime.    [provider]  Multiple Vitamins-Minerals (EMERGEN-C IMMUNE PO) Take 1 packet by mouth daily.    [provider]  naproxen (NAPROSYN) 500 MG tablet Take 1 tablet (500 mg  total) by mouth 2 (two) times daily as needed for headache. 03/28/19   Wieters, Hallie C, PA-C  omeprazole (PRILOSEC) 40 MG capsule TAKE 1 CAPSULE BY MOUTH EVERY DAY 02/18/18   Hilarie Fredrickson, MD  phentermine 37.5 MG capsule Take 37.5 mg by mouth every morning.    [provider]  predniSONE (DELTASONE) 20 MG tablet Take 2 tablets (40 mg total) by mouth daily with breakfast for 5 days. 12/19/19 12/24/19  Georgetta Haber, NP  rosuvastatin (CRESTOR) 10 MG tablet Take 1 tablet (10 mg total) by mouth daily. 03/28/19   Wieters, Junius Creamer, PA-C    Family History Family History  Problem Relation Age of Onset  . Lupus Mother   . Asthma Mother   . Colon polyps Mother   . Hypertension Father        Deceased at age 51  . Asthma Sister   . Asthma Maternal Grandmother   . Migraines Daughter   . Colon cancer Maternal Aunt   . Colon cancer Cousin     Social History Social History   Tobacco Use  . Smoking status: Former  Smoker    Packs/day: 0.00    Years: 24.00    Pack years: 0.00    Quit date: 06/19/2017    Years since quitting: 2.5  . Smokeless tobacco: Never Used  Vaping Use  . Vaping Use: Never used  Substance Use Topics  . Alcohol use: No  . Drug use: No     Allergies   Demerol [meperidine]   Review of Systems Review of Systems   Physical Exam Triage Vital Signs ED Triage Vitals  Enc Vitals Group     BP 12/19/19 0829 (!) 137/88     Pulse Rate 12/19/19 0829 96     Resp 12/19/19 0829 20     Temp 12/19/19 0829 98.2 F (36.8 C)     Temp Source 12/19/19 0829 Oral     SpO2 12/19/19 0829 97 %     Weight --      Height --      Head Circumference --      Peak Flow --      Pain Score 12/19/19 0831 6     Pain Loc --      Pain Edu? --      Excl. in GC? --    No data found.  Updated Vital Signs BP (!) 137/88 (BP Location: Right Arm)   Pulse 96   Temp 98.2 F (36.8 C) (Oral)   Resp 20   SpO2 97%   Visual Acuity Right Eye Distance:   Left Eye Distance:   Bilateral Distance:    Right Eye Near:   Left Eye Near:    Bilateral Near:     Physical Exam Constitutional:      General: She is not in acute distress.    Appearance: She is well-developed.  HENT:     Head: Normocephalic and atraumatic.     Right Ear: Tympanic membrane, ear canal and external ear normal.     Left Ear: Tympanic membrane, ear canal and external ear normal.     Ears:     Comments: Cerumen to right ear canal with only partial visualization of right TM which appears WNL     Nose: Rhinorrhea present.     Mouth/Throat:     Pharynx: Uvula midline.     Tonsils: No tonsillar exudate.     Comments: Post nasal drip noted  Eyes:  Conjunctiva/sclera: Conjunctivae normal.     Pupils: Pupils are equal, round, and reactive to light.  Cardiovascular:     Rate and Rhythm: Normal rate and regular rhythm.     Heart sounds: Normal heart sounds.  Pulmonary:     Effort: Pulmonary effort is normal. No tachypnea or  accessory muscle usage.     Breath sounds: Normal breath sounds.     Comments: Cough is triggered by deep breathing; no work of breathing noted; occasional strong congested cough Skin:    General: Skin is warm and dry.  Neurological:     Mental Status: She is alert and oriented to person, place, and time.      UC Treatments / Results  Labs (all labs ordered are listed, but only abnormal results are displayed) Labs Reviewed  SARS CORONAVIRUS 2 (TAT 6-24 HRS)    EKG   Radiology No results found.  Procedures Procedures (including critical care time)  Medications Ordered in UC Medications - No data to display  Initial Impression / Assessment and Plan / UC Course  I have reviewed the triage vital signs and the nursing notes.  Pertinent labs & imaging results that were available during my care of the patient were reviewed by me and considered in my medical decision making (see chart for details).     Cough, congestion, mucus drainage. No specific known fevers. Empiric azithromycin as well as prednisone provided- states she had used a family member's nebulizer which did seem to help with her breathing. Inhaler provided. covid testing pending. Return precautions provided. Patient verbalized understanding and agreeable to plan.   Final Clinical Impressions(s) / UC Diagnoses   Final diagnoses:  Lower respiratory infection     Discharge Instructions     Push fluids to ensure adequate hydration and keep secretions thin.  Tylenol and/or ibuprofen as needed for pain or fevers.  Use of inhaler as needed for wheezing or shortness of breath.   5 days of prednisone.  If your covid test comes back positive no need to take azithromycin.  Self isolate until covid results are back and negative.  Will notify you by phone of any positive findings. Your negative results will be sent through your MyChart.         ED Prescriptions    Medication Sig Dispense Auth. Provider    azithromycin (ZITHROMAX) 250 MG tablet Take 2 tablets (500 mg total) by mouth daily for 1 day, THEN 1 tablet (250 mg total) daily for 4 days. 6 tablet Linus Mako B, NP   predniSONE (DELTASONE) 20 MG tablet Take 2 tablets (40 mg total) by mouth daily with breakfast for 5 days. 10 tablet Linus Mako B, NP   albuterol (PROAIR HFA) 108 (90 Base) MCG/ACT inhaler Inhale 1-2 puffs into the lungs every 6 (six) hours as needed for wheezing or shortness of breath. 8 g Georgetta Haber, NP     PDMP not reviewed this encounter.   Georgetta Haber, NP 12/19/19 [phone]

## 2019-12-19 NOTE — Discharge Instructions (Signed)
Push fluids to ensure adequate hydration and keep secretions thin.  Tylenol and/or ibuprofen as needed for pain or fevers.  Use of inhaler as needed for wheezing or shortness of breath.   5 days of prednisone.  If your covid test comes back positive no need to take azithromycin.  Self isolate until covid results are back and negative.  Will notify you by phone of any positive findings. Your negative results will be sent through your MyChart.

## 2019-12-19 NOTE — ED Triage Notes (Signed)
Pt presents with shortness of breath, non productive cough, sore throat, and headache X 2 days.

## 2021-01-13 ENCOUNTER — Other Ambulatory Visit: Payer: Self-pay

## 2021-01-13 ENCOUNTER — Encounter (HOSPITAL_COMMUNITY): Payer: Self-pay

## 2021-01-13 ENCOUNTER — Emergency Department (HOSPITAL_COMMUNITY)
Admission: EM | Admit: 2021-01-13 | Discharge: 2021-01-13 | Disposition: A | Discharge status: Home / Self Care | Payer: Self-pay | Attending: Emergency Medicine | Admitting: Emergency Medicine

## 2021-01-13 ENCOUNTER — Emergency Department (HOSPITAL_COMMUNITY): Payer: Self-pay

## 2021-01-13 DIAGNOSIS — Z87891 Personal history of nicotine dependence: Secondary | ICD-10-CM | POA: Insufficient documentation

## 2021-01-13 DIAGNOSIS — Z79899 Other long term (current) drug therapy: Secondary | ICD-10-CM | POA: Insufficient documentation

## 2021-01-13 DIAGNOSIS — M25562 Pain in left knee: Secondary | ICD-10-CM | POA: Insufficient documentation

## 2021-01-13 DIAGNOSIS — Y9289 Other specified places as the place of occurrence of the external cause: Secondary | ICD-10-CM | POA: Insufficient documentation

## 2021-01-13 DIAGNOSIS — Y9389 Activity, other specified: Secondary | ICD-10-CM | POA: Insufficient documentation

## 2021-01-13 DIAGNOSIS — I10 Essential (primary) hypertension: Secondary | ICD-10-CM | POA: Insufficient documentation

## 2021-01-13 DIAGNOSIS — S8392XA Sprain of unspecified site of left knee, initial encounter: Secondary | ICD-10-CM

## 2021-01-13 DIAGNOSIS — X501XXA Overexertion from prolonged static or awkward postures, initial encounter: Secondary | ICD-10-CM | POA: Insufficient documentation

## 2021-01-13 MED ORDER — AMLODIPINE BESYLATE 5 MG PO TABS
5.0000 mg | ORAL_TABLET | Freq: Once | ORAL | Status: AC
  Administered 2021-01-13: 5 mg via ORAL
  Filled 2021-01-13: qty 1

## 2021-01-13 MED ORDER — NAPROXEN 375 MG PO TABS: 375.0000 mg | ORAL_TABLET | Freq: Two times a day (BID) | ORAL | 0 refills | Status: DC

## 2021-01-13 MED ORDER — AMLODIPINE BESYLATE 5 MG PO TABS: 5.0000 mg | ORAL_TABLET | Freq: Every day | ORAL | 1 refills | Status: DC

## 2021-01-13 NOTE — Discharge Instructions (Addendum)
Your XRAY was negative and I suspect that you sprained your knee. You will need to follow up with a primary care doctor for further management of your blood pressure.  I have refilled your blood pressure medications.  I have also discharged you with a long-acting anti-inflammatory.  You may also take Tylenol for pain.  Continue to use the RICE protocol (rest ice compress and elevate) for your knee pain.  Return for any new or worsening symptoms.  Get help right away if you: Develop a severe headache or confusion. Have unusual weakness or numbness. Feel faint. Have severe pain in your chest or abdomen. Vomit repeatedly. Have trouble breathing.

## 2021-01-13 NOTE — ED Triage Notes (Signed)
Pt endorses headache and hypertension for a few days. Pt reports running out of her BP medication.

## 2021-01-13 NOTE — ED Provider Notes (Signed)
[facility]-EMERGENCY DEPT Provider Note   CSN: [id number] Arrival date & time: 01/13/21  0802     History Chief Complaint  Patient presents with   Hypertension   Headache    Debra Welch is a 55 y.o. female with a past medical history of seasonal allergies, hypertension, hyperlipidemia, smoking who presents emergency department chief complaint of left knee pain.  Patient states that 2 days ago she was getting out of her bed when she planted, twisted on the knee and had sudden onset of severe pain.  She denies mechanical symptoms such as clicking locking catching or feelings of instability.  She has had some swelling.  She has been using RICE protocol and NSAIDs at home.  Patient states that she also developed headache and feels like this is secondary to her blood pressure being high.  She has previously been on amlodipine 5 mg but has been out of her medication and trying to control it with diet at home.  She denies changes in vision, unilateral weakness, difficulty with speech or swallowing, fever, neck stiffness, or other neurologic complaints.   Hypertension Associated symptoms include headaches.  Headache     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   Tobacco use disorder 06/06/2014   Sleep disturbance 06/06/2014   Obesity 06/06/2014    Past Surgical History:  Procedure Laterality Date   ABDOMINAL HYSTERECTOMY       OB History     Gravida  3   Para  3   Term      Preterm      AB      Living  3      SAB      IAB      Ectopic      Multiple      Live Births              Family History  Problem Relation Age of Onset    Lupus Mother    Asthma Mother    Colon polyps Mother    Hypertension Father        Deceased at age 1   Asthma Sister    Asthma Maternal Grandmother    Migraines Daughter    Colon cancer Maternal Aunt    Colon cancer Cousin     Social History   Tobacco Use   Smoking status: Former    Packs/day: 0.00    Years: 24.00    Pack years: 0.00    Types: Cigarettes    Quit date: 06/19/2017    Years since quitting: 3.5   Smokeless tobacco: Never  Vaping Use   Vaping Use: Never used  Substance Use Topics   Alcohol use: No   Drug use: No    Home Medications Prior to Admission medications   Medication Sig Start Date End Date Taking? Authorizing Provider  acetaminophen (TYLENOL) 500 MG tablet Take 1,000 mg by mouth daily as needed (pain).    [provider]  albuterol (PROAIR HFA) 108 (90 Base) MCG/ACT inhaler Inhale 1-2 puffs into the lungs every 6 (six)  hours as needed for wheezing or shortness of breath. 12/19/19   Linus Mako B, NP  amLODipine (NORVASC) 5 MG tablet Take 1 tablet (5 mg total) by mouth daily. 03/28/19   Wieters, Hallie C, PA-C  cetirizine (ZYRTEC) 10 MG tablet TAKE 1 TABLET BY MOUTH EVERY DAY  "NO MORE REFILLS WITHOUT OFFICE VISIT" 04/06/15   Deatra James, MD  montelukast (SINGULAIR) 10 MG tablet Take 10 mg by mouth at bedtime.    [provider]  Multiple Vitamins-Minerals (EMERGEN-C IMMUNE PO) Take 1 packet by mouth daily.    [provider]  naproxen (NAPROSYN) 500 MG tablet Take 1 tablet (500 mg total) by mouth 2 (two) times daily as needed for headache. 03/28/19   Wieters, Hallie C, PA-C  omeprazole (PRILOSEC) 40 MG capsule TAKE 1 CAPSULE BY MOUTH EVERY DAY 02/18/18   Hilarie Fredrickson, MD  phentermine 37.5 MG capsule Take 37.5 mg by mouth every morning.    [provider]  rosuvastatin (CRESTOR) 10 MG tablet Take 1 tablet (10 mg total) by mouth daily. 03/28/19   Wieters, Hallie C, PA-C    Allergies    Demerol [meperidine]  Review  of Systems   Review of Systems  Neurological:  Positive for headaches.  Musculoskeletal: Positive for knee pain and swelling Ten systems reviewed and are negative for acute change, except as noted in the HPI.   Physical Exam Updated Vital Signs BP (!) 154/101 (BP Location: Left Arm)   Pulse (!) 103   Temp (!) 97.4 F (36.3 C) (Oral)   Resp 18   SpO2 93%   Physical Exam Vitals and nursing note reviewed.  Constitutional:      General: She is not in acute distress.    Appearance: She is well-developed. She is not diaphoretic.  HENT:     Head: Normocephalic and atraumatic.     Nose: Nose normal.     Mouth/Throat:     Mouth: Mucous membranes are moist.  Eyes:     General: No scleral icterus.    Conjunctiva/sclera: Conjunctivae normal.     Pupils: Pupils are equal, round, and reactive to light.     Comments: No horizontal, vertical or rotational nystagmus  Neck:     Comments: Full active and passive ROM without pain No midline or paraspinal tenderness No nuchal rigidity or meningeal signs Cardiovascular:     Rate and Rhythm: Normal rate and regular rhythm.  Pulmonary:     Effort: Pulmonary effort is normal. No respiratory distress.     Breath sounds: No wheezing or rales.  Abdominal:     General: There is no distension.     Palpations: Abdomen is soft.     Tenderness: There is no abdominal tenderness. There is no guarding or rebound.  Musculoskeletal:        General: Normal range of motion.     Cervical back: Normal range of motion and neck supple.     Comments: Left Knee with swelling. Pain over the medial/inferior aspect of her left knee.  Lymphadenopathy:     Cervical: No cervical adenopathy.  Skin:    General: Skin is warm and dry.     Findings: No rash.  Neurological:     Mental Status: She is alert and oriented to person, place, and time.     Cranial Nerves: No cranial nerve deficit.     Motor: No abnormal muscle tone.     Coordination: Coordination normal.      Comments:  Mental Status:  Alert, oriented, thought content appropriate. Speech fluent without evidence of aphasia. Able to follow 2 step commands without difficulty.  Cranial Nerves:  II:  Peripheral visual fields grossly normal, pupils equal, round, reactive to light III,IV, VI: ptosis not present, extra-ocular motions intact bilaterally  V,VII: smile symmetric, facial light touch sensation equal VIII: hearing grossly normal bilaterally  IX,X: midline uvula rise  XI: bilateral shoulder shrug equal and strong XII: midline tongue extension  Motor:  5/5 in upper and lower extremities bilaterally including strong and equal grip strength and dorsiflexion/plantar flexion Sensory: Pinprick and light touch normal in all extremities.  Cerebellar: normal finger-to-nose with bilateral upper extremities Gait: normal gait and balance CV: distal pulses palpable throughout   Psychiatric:        Behavior: Behavior normal.        Thought Content: Thought content normal.        Judgment: Judgment normal.    ED Results / Procedures / Treatments   Labs (all labs ordered are listed, but only abnormal results are displayed) Labs Reviewed - No data to display  EKG None  Radiology No results found.  Procedures Procedures   Medications Ordered in ED Medications  amLODipine (NORVASC) tablet 5 mg (has no administration in time range)    ED Course  I have reviewed the triage vital signs and the nursing notes.  Pertinent labs & imaging results that were available during my care of the patient were reviewed by me and considered in my medical decision making (see chart for details).    MDM Rules/Calculators/A&P                           55 year old female here with complaint of mild headache, knee pain, and hypertension.  Patient given a dose of her regular hypertensive medications with improvement in her headache symptoms. She has no evidence of emergent cause of headache such as ICH,  meningitis.I doubt space containing lesion.  She has a normal neurologic examination.  I ordered and reviewed a left knee x-ray which shows no acute abnormalities.  I suspect the patient has a knee sprain.  Given knee immobilizer and crutches for symptomatic relief with orthopedic outpatient follow-up.  Final Clinical Impression(s) / ED Diagnoses Final diagnoses:  None    Rx / DC Orders ED Discharge Orders     None        Arthor Captain, PA-C 01/13/21 1523    Virgina Norfolk, DO 01/13/21 1524

## 2021-01-15 ENCOUNTER — Emergency Department (HOSPITAL_COMMUNITY): Payer: Self-pay

## 2021-01-15 ENCOUNTER — Emergency Department (HOSPITAL_COMMUNITY)
Admission: EM | Admit: 2021-01-15 | Discharge: 2021-01-15 | Disposition: A | Discharge status: Home / Self Care | Payer: Self-pay | Attending: Emergency Medicine | Admitting: Emergency Medicine

## 2021-01-15 ENCOUNTER — Encounter (HOSPITAL_COMMUNITY): Payer: Self-pay | Admitting: *Deleted

## 2021-01-15 ENCOUNTER — Other Ambulatory Visit: Payer: Self-pay

## 2021-01-15 DIAGNOSIS — R519 Headache, unspecified: Secondary | ICD-10-CM | POA: Insufficient documentation

## 2021-01-15 DIAGNOSIS — I10 Essential (primary) hypertension: Secondary | ICD-10-CM | POA: Insufficient documentation

## 2021-01-15 DIAGNOSIS — Z79899 Other long term (current) drug therapy: Secondary | ICD-10-CM | POA: Insufficient documentation

## 2021-01-15 DIAGNOSIS — Z87891 Personal history of nicotine dependence: Secondary | ICD-10-CM | POA: Insufficient documentation

## 2021-01-15 LAB — CBC WITH DIFFERENTIAL/PLATELET
Abs Immature Granulocytes: 0.02 10*3/uL (ref 0.00–0.07)
Basophils Absolute: 0 10*3/uL (ref 0.0–0.1)
Basophils Relative: 0 %
Eosinophils Absolute: 0.2 10*3/uL (ref 0.0–0.5)
Eosinophils Relative: 5 %
HCT: 41.3 % (ref 36.0–46.0)
Hemoglobin: 13.3 g/dL (ref 12.0–15.0)
Immature Granulocytes: 0 %
Lymphocytes Relative: 27 %
Lymphs Abs: 1.3 10*3/uL (ref 0.7–4.0)
MCH: 28.2 pg (ref 26.0–34.0)
MCHC: 32.2 g/dL (ref 30.0–36.0)
MCV: 87.5 fL (ref 80.0–100.0)
Monocytes Absolute: 0.5 10*3/uL (ref 0.1–1.0)
Monocytes Relative: 10 %
Neutro Abs: 2.7 10*3/uL (ref 1.7–7.7)
Neutrophils Relative %: 58 %
Platelets: 230 10*3/uL (ref 150–400)
RBC: 4.72 MIL/uL (ref 3.87–5.11)
RDW: 14.4 % (ref 11.5–15.5)
WBC: 4.7 10*3/uL (ref 4.0–10.5)
nRBC: 0 % (ref 0.0–0.2)

## 2021-01-15 LAB — COMPREHENSIVE METABOLIC PANEL
ALT: 41 U/L (ref 0–44)
AST: 24 U/L (ref 15–41)
Albumin: 4.5 g/dL (ref 3.5–5.0)
Alkaline Phosphatase: 91 U/L (ref 38–126)
Anion gap: 7 (ref 5–15)
BUN: 13 mg/dL (ref 6–20)
CO2: 27 mmol/L (ref 22–32)
Calcium: 9.3 mg/dL (ref 8.9–10.3)
Chloride: 110 mmol/L (ref 98–111)
Creatinine, Ser: 1.04 mg/dL — ABNORMAL HIGH (ref 0.44–1.00)
GFR, Estimated: >60 mL/min (ref >60)
Glucose, Bld: 154 mg/dL — ABNORMAL HIGH (ref 70–99)
Potassium: 3.8 mmol/L (ref 3.5–5.1)
Sodium: 144 mmol/L (ref 135–145)
Total Bilirubin: 0.6 mg/dL (ref 0.3–1.2)
Total Protein: 7.3 g/dL (ref 6.5–8.1)

## 2021-01-15 LAB — TROPONIN I (HIGH SENSITIVITY)
Troponin I (High Sensitivity): 5 ng/L (ref <18)
Troponin I (High Sensitivity): 6 ng/L (ref <18)

## 2021-01-15 MED ORDER — PROCHLORPERAZINE EDISYLATE 10 MG/2ML IJ SOLN
5.0000 mg | Freq: Once | INTRAMUSCULAR | Status: AC
  Administered 2021-01-15: 5 mg via INTRAVENOUS
  Filled 2021-01-15: qty 2

## 2021-01-15 MED ORDER — KETOROLAC TROMETHAMINE 30 MG/ML IJ SOLN
30.0000 mg | Freq: Once | INTRAMUSCULAR | Status: AC
  Administered 2021-01-15: 30 mg via INTRAMUSCULAR
  Filled 2021-01-15: qty 1

## 2021-01-15 MED ORDER — LABETALOL HCL 5 MG/ML IV SOLN
10.0000 mg | Freq: Once | INTRAVENOUS | Status: AC
  Administered 2021-01-15: 10 mg via INTRAVENOUS
  Filled 2021-01-15: qty 4

## 2021-01-15 NOTE — ED Triage Notes (Signed)
Pt complains of headache, sob, blurred vision. She reports her hypertension is not controlled by BP medication recently prescribed.

## 2021-01-15 NOTE — Discharge Instructions (Addendum)
Follow with your primary care doctor.  You potentially may need increasing her blood pressure medicine although I do not think you have been on it long enough for her to take full effect yet.

## 2021-01-15 NOTE — ED Provider Notes (Signed)
[facility]-EMERGENCY DEPT Provider Note   CSN: [id number] Arrival date & time: 01/15/21  1418     History Chief Complaint  Patient presents with   Hypertension   Headache    Debra Welch is a 55 y.o. female.   Hypertension Associated symptoms include chest pain and headaches. Pertinent negatives include no abdominal pain.  Headache Associated symptoms: no abdominal pain, no back pain and no congestion   Patient presents with hypertension and headaches.  Had a history of hypertension and had been off her medicine for months due to social issues.  States she began to have elevated blood pressure.  States she checked it at home and it was 180s systolic.  Seen in the ER 2 days ago and started back on her amlodipine.  States she is also been having headaches.  Happened around 3 days.  They are dull and frontal.  Similar to previous headaches when her blood pressure goes up.  States her vision was a little blurred when she woke up this morning.  Mild relief with naproxen.  Will get chest tightness at times.  No fevers or chills.  No numbness or weakness.  Has a history of migraines also but states these headaches are similar what she had before but not her migraines.    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   Tobacco use disorder 06/06/2014   Sleep disturbance 06/06/2014   Obesity 06/06/2014    Past Surgical History:  Procedure Laterality Date   ABDOMINAL HYSTERECTOMY       OB History     Gravida  3   Para  3   Term      Preterm      AB      Living  3      SAB      IAB      Ectopic      Multiple      Live Births               Family History  Problem Relation Age of Onset   Lupus Mother    Asthma Mother    Colon polyps Mother    Hypertension Father        Deceased at age 68   Asthma Sister    Asthma Maternal Grandmother    Migraines Daughter    Colon cancer Maternal Aunt    Colon cancer Cousin     Social History   Tobacco Use   Smoking status: Former    Packs/day: 0.00    Years: 24.00    Pack years: 0.00    Types: Cigarettes    Quit date: 06/19/2017    Years since quitting: 3.5   Smokeless tobacco: Never  Vaping Use   Vaping Use: Never used  Substance Use Topics   Alcohol use: No   Drug use: No    Home Medications Prior to Admission medications   Medication Sig Start Date End Date Taking? Authorizing Provider  amLODipine (NORVASC) 5 MG tablet Take 1 tablet (5 mg total) by mouth daily. 01/13/21  Yes Harris, Abigail, PA-C  naproxen (NAPROSYN) 375 MG tablet Take 1 tablet (375 mg total) by mouth 2 (two) times daily. 01/13/21  Yes Harris, Abigail, PA-C  albuterol (PROAIR HFA) 108 (90 Base) MCG/ACT inhaler Inhale 1-2 puffs into the lungs every 6 (six) hours as needed for wheezing or shortness of breath. Patient not taking: No sig reported 12/19/19   Linus Mako B, NP  amLODipine (NORVASC) 5 MG tablet Take 1 tablet (5 mg total) by mouth daily. Patient not taking: No sig reported 03/28/19   Wieters, Hallie C, PA-C  cetirizine (ZYRTEC) 10 MG tablet TAKE 1 TABLET BY MOUTH EVERY DAY  "NO MORE REFILLS WITHOUT OFFICE VISIT" Patient not taking: Reported on 01/15/2021 04/06/15   Deatra James, MD  naproxen (NAPROSYN) 500 MG tablet Take 1 tablet (500 mg total) by mouth 2 (two) times daily as needed for headache. Patient not taking: No sig reported 03/28/19   Wieters, Hallie C, PA-C  omeprazole (PRILOSEC) 40 MG capsule TAKE 1 CAPSULE BY MOUTH EVERY DAY Patient not taking: No sig reported 02/18/18   Hilarie Fredrickson, MD  rosuvastatin (CRESTOR) 10 MG tablet Take 1 tablet (10 mg total) by mouth daily. Patient not  taking: No sig reported 03/28/19   Wieters, Hallie C, PA-C    Allergies    Demerol [meperidine]  Review of Systems   Review of Systems  Constitutional:  Negative for appetite change.  HENT:  Negative for congestion.   Eyes:  Positive for visual disturbance.  Cardiovascular:  Positive for chest pain.  Gastrointestinal:  Negative for abdominal pain.  Genitourinary:  Negative for flank pain.  Musculoskeletal:  Negative for back pain.  Skin:  Negative for rash.  Neurological:  Positive for headaches.  Psychiatric/Behavioral:  Negative for confusion.    Physical Exam Updated Vital Signs BP (!) 154/99   Pulse 86   Temp 98.9 F (37.2 C) (Oral)   Resp 19   SpO2 96%   Physical Exam Vitals and nursing note reviewed.  Constitutional:      Appearance: She is obese.  HENT:     Head: Atraumatic.  Cardiovascular:     Rate and Rhythm: Regular rhythm.  Pulmonary:     Breath sounds: No wheezing or rhonchi.  Abdominal:     Tenderness: There is no abdominal tenderness.  Musculoskeletal:     Cervical back: Normal range of motion and neck supple.  Skin:    General: Skin is warm.     Capillary Refill: Capillary refill takes less than 2 seconds.  Neurological:     Mental Status: She is alert.    ED Results / Procedures / Treatments   Labs (all labs ordered are listed, but only abnormal results are displayed) Labs Reviewed  COMPREHENSIVE METABOLIC PANEL - Abnormal; Notable for the following components:      Result Value   Glucose, Bld 154 (*)    Creatinine, Ser 1.04 (*)    All other components within normal limits  CBC WITH DIFFERENTIAL/PLATELET  TROPONIN I (HIGH SENSITIVITY)  TROPONIN I (HIGH SENSITIVITY)    EKG EKG Interpretation  Date/Time:  Tuesday January 15 2021 15:44:41 EDT Ventricular Rate:  99 PR Interval:  139 QRS Duration: 85 QT Interval:  362 QTC Calculation: 465 R Axis:   13 Text Interpretation: Sinus rhythm Abnormal R-wave progression, early transition No  old tracing to compare Confirmed by Benjiman Core [phone]) on 01/15/2021 4:49:40 PM  Radiology DG Chest Portable 1 View  Result Date: 01/15/2021 CLINICAL DATA:  Shortness of breath. EXAM: PORTABLE CHEST 1 VIEW COMPARISON:  June 28, 2009. FINDINGS: The heart size and mediastinal contours are within normal limits. Both lungs are clear. The visualized skeletal structures are unremarkable. IMPRESSION: No active disease. Electronically Signed   By: Lupita Raider M.D.   On: 01/15/2021 16:48    Procedures Procedures   Medications Ordered in ED Medications  ketorolac (TORADOL) 30 MG/ML injection 30 mg (30 mg Intramuscular Given 01/15/21 1707)  labetalol (NORMODYNE) injection 10 mg (10 mg Intravenous Given 01/15/21 1928)  prochlorperazine (COMPAZINE) injection 5 mg (5 mg Intravenous Given 01/15/21 1927)    ED Course  I have reviewed the triage vital signs and the nursing notes.  Pertinent labs & imaging results that were available during my care of the patient were reviewed by me and considered in my medical decision making (see chart for details).    MDM Rules/Calculators/A&P                           Patient presents with hypertension.  History of same.  Has been off medicines for a while.  Just started on a few days ago.  However blood pressure elevated here.  Headache typical for her hypertensive headaches.  Nonfocal exam.  Has had previous migraines but this feels different.  Work-up reassuring.  Good renal function similar to prior.  EKG also reassuring.  Given initially some medicines help with headache.  Did not really help and remain hypertensive.  Later gave some labetalol for more expeditious blood pressure lowering.  However has not been on the amlodipine long enough to know if it is effective or not.  We will need more time needs follow-up with the PCP.  Feels better after Compazine.  Blood pressure also decreased although still overall elevated.  Will discharge home Final  Clinical Impression(s) / ED Diagnoses Final diagnoses:  Hypertension, unspecified type  Acute nonintractable headache, unspecified headache type    Rx / DC Orders ED Discharge Orders     None        Benjiman Core, MD 01/15/21 2359

## 2021-01-15 NOTE — ED Provider Notes (Signed)
Emergency Medicine Provider Triage Evaluation Note  Debra Welch , a 55 y.o. female  was evaluated in triage.  Pt complains of uncontrolled hypertensions, shortness of breath, chest tightness, headaches and vision changes for the last three days. She reports she is being woken up from sleep by the headaches. She has tries taking naproxen but reports only temporary relief. She denies chest pain, facial droop, limb weakness. She stopped taking her blood pressure medication due to insurance issues.  Review of Systems  Positive: As above Negative: As above  Physical Exam  BP (!) 177/99 (BP Location: Right Arm)   Pulse (!) 103   Temp 98.9 F (37.2 C) (Oral)   Resp 18   SpO2 98%  Gen:   Awake, some distress Resp:  Normal effort MSK:   Moves extremities without difficulty Other:    Medical Decision Making  Medically screening exam initiated at 3:28 PM.  Appropriate orders placed.  Debra Welch was informed that the remainder of the evaluation will be completed by another provider, this initial triage assessment does not replace that evaluation, and the importance of remaining in the ED until their evaluation is complete.  Chest tightness, shortness of breath, uncontrolled HTN   [place] 01/15/21 1531    Wynetta Fines, MD 01/16/21 2224

## 2021-08-01 DIAGNOSIS — J302 Other seasonal allergic rhinitis: Secondary | ICD-10-CM | POA: Diagnosis not present

## 2021-08-01 DIAGNOSIS — R69 Illness, unspecified: Secondary | ICD-10-CM | POA: Diagnosis not present

## 2021-08-01 DIAGNOSIS — N898 Other specified noninflammatory disorders of vagina: Secondary | ICD-10-CM | POA: Diagnosis not present

## 2021-08-06 DIAGNOSIS — D241 Benign neoplasm of right breast: Secondary | ICD-10-CM | POA: Diagnosis not present

## 2021-08-06 DIAGNOSIS — N6311 Unspecified lump in the right breast, upper outer quadrant: Secondary | ICD-10-CM | POA: Diagnosis not present

## 2021-08-06 DIAGNOSIS — N6312 Unspecified lump in the right breast, upper inner quadrant: Secondary | ICD-10-CM | POA: Diagnosis not present

## 2021-08-06 DIAGNOSIS — R922 Inconclusive mammogram: Secondary | ICD-10-CM | POA: Diagnosis not present

## 2021-08-06 DIAGNOSIS — R921 Mammographic calcification found on diagnostic imaging of breast: Secondary | ICD-10-CM | POA: Diagnosis not present

## 2021-09-03 DIAGNOSIS — N6021 Fibroadenosis of right breast: Secondary | ICD-10-CM | POA: Diagnosis not present

## 2021-09-03 DIAGNOSIS — N6489 Other specified disorders of breast: Secondary | ICD-10-CM | POA: Diagnosis not present

## 2021-09-03 DIAGNOSIS — N6031 Fibrosclerosis of right breast: Secondary | ICD-10-CM | POA: Diagnosis not present

## 2021-09-03 DIAGNOSIS — R920 Mammographic microcalcification found on diagnostic imaging of breast: Secondary | ICD-10-CM | POA: Diagnosis not present

## 2021-09-03 DIAGNOSIS — R921 Mammographic calcification found on diagnostic imaging of breast: Secondary | ICD-10-CM | POA: Diagnosis not present

## 2021-09-03 DIAGNOSIS — D0511 Intraductal carcinoma in situ of right breast: Secondary | ICD-10-CM | POA: Diagnosis not present

## 2021-09-03 DIAGNOSIS — C50411 Malignant neoplasm of upper-outer quadrant of right female breast: Secondary | ICD-10-CM | POA: Diagnosis not present

## 2021-09-13 DIAGNOSIS — C50911 Malignant neoplasm of unspecified site of right female breast: Secondary | ICD-10-CM | POA: Diagnosis not present

## 2021-09-13 DIAGNOSIS — Z171 Estrogen receptor negative status [ER-]: Secondary | ICD-10-CM | POA: Diagnosis not present

## 2021-09-13 DIAGNOSIS — C50411 Malignant neoplasm of upper-outer quadrant of right female breast: Secondary | ICD-10-CM | POA: Diagnosis not present

## 2021-09-13 DIAGNOSIS — R221 Localized swelling, mass and lump, neck: Secondary | ICD-10-CM | POA: Diagnosis not present

## 2021-09-17 DIAGNOSIS — Z171 Estrogen receptor negative status [ER-]: Secondary | ICD-10-CM | POA: Diagnosis not present

## 2021-09-17 DIAGNOSIS — C50912 Malignant neoplasm of unspecified site of left female breast: Secondary | ICD-10-CM | POA: Diagnosis not present

## 2021-09-17 DIAGNOSIS — C50411 Malignant neoplasm of upper-outer quadrant of right female breast: Secondary | ICD-10-CM | POA: Diagnosis not present

## 2021-09-19 DIAGNOSIS — I517 Cardiomegaly: Secondary | ICD-10-CM | POA: Diagnosis not present

## 2021-09-23 DIAGNOSIS — Z452 Encounter for adjustment and management of vascular access device: Secondary | ICD-10-CM | POA: Diagnosis not present

## 2021-09-23 DIAGNOSIS — Z171 Estrogen receptor negative status [ER-]: Secondary | ICD-10-CM | POA: Diagnosis not present

## 2021-09-23 DIAGNOSIS — C50411 Malignant neoplasm of upper-outer quadrant of right female breast: Secondary | ICD-10-CM | POA: Diagnosis not present

## 2021-09-25 DIAGNOSIS — R221 Localized swelling, mass and lump, neck: Secondary | ICD-10-CM | POA: Diagnosis not present

## 2021-09-25 DIAGNOSIS — J9811 Atelectasis: Secondary | ICD-10-CM | POA: Diagnosis not present

## 2021-09-25 DIAGNOSIS — C50411 Malignant neoplasm of upper-outer quadrant of right female breast: Secondary | ICD-10-CM | POA: Diagnosis not present

## 2021-09-25 DIAGNOSIS — Z171 Estrogen receptor negative status [ER-]: Secondary | ICD-10-CM | POA: Diagnosis not present

## 2021-09-25 DIAGNOSIS — C760 Malignant neoplasm of head, face and neck: Secondary | ICD-10-CM | POA: Diagnosis not present

## 2021-09-25 DIAGNOSIS — M170 Bilateral primary osteoarthritis of knee: Secondary | ICD-10-CM | POA: Diagnosis not present

## 2021-09-25 DIAGNOSIS — Z90710 Acquired absence of both cervix and uterus: Secondary | ICD-10-CM | POA: Diagnosis not present

## 2021-09-25 DIAGNOSIS — M19012 Primary osteoarthritis, left shoulder: Secondary | ICD-10-CM | POA: Diagnosis not present

## 2021-09-25 DIAGNOSIS — R1909 Other intra-abdominal and pelvic swelling, mass and lump: Secondary | ICD-10-CM | POA: Diagnosis not present

## 2021-09-25 DIAGNOSIS — M47816 Spondylosis without myelopathy or radiculopathy, lumbar region: Secondary | ICD-10-CM | POA: Diagnosis not present

## 2021-09-25 DIAGNOSIS — N6310 Unspecified lump in the right breast, unspecified quadrant: Secondary | ICD-10-CM | POA: Diagnosis not present

## 2021-09-25 DIAGNOSIS — C50911 Malignant neoplasm of unspecified site of right female breast: Secondary | ICD-10-CM | POA: Diagnosis not present

## 2021-09-25 DIAGNOSIS — K5790 Diverticulosis of intestine, part unspecified, without perforation or abscess without bleeding: Secondary | ICD-10-CM | POA: Diagnosis not present

## 2021-09-25 DIAGNOSIS — M47812 Spondylosis without myelopathy or radiculopathy, cervical region: Secondary | ICD-10-CM | POA: Diagnosis not present

## 2021-09-25 DIAGNOSIS — N281 Cyst of kidney, acquired: Secondary | ICD-10-CM | POA: Diagnosis not present

## 2021-09-27 DIAGNOSIS — D7389 Other diseases of spleen: Secondary | ICD-10-CM | POA: Diagnosis not present

## 2021-09-27 DIAGNOSIS — G629 Polyneuropathy, unspecified: Secondary | ICD-10-CM | POA: Diagnosis not present

## 2021-09-27 DIAGNOSIS — R220 Localized swelling, mass and lump, head: Secondary | ICD-10-CM | POA: Diagnosis not present

## 2021-09-27 DIAGNOSIS — N898 Other specified noninflammatory disorders of vagina: Secondary | ICD-10-CM | POA: Diagnosis not present

## 2021-09-27 DIAGNOSIS — R110 Nausea: Secondary | ICD-10-CM | POA: Diagnosis not present

## 2021-09-27 DIAGNOSIS — C50411 Malignant neoplasm of upper-outer quadrant of right female breast: Secondary | ICD-10-CM | POA: Diagnosis not present

## 2021-09-27 DIAGNOSIS — Z95828 Presence of other vascular implants and grafts: Secondary | ICD-10-CM | POA: Diagnosis not present

## 2021-09-27 DIAGNOSIS — L299 Pruritus, unspecified: Secondary | ICD-10-CM | POA: Diagnosis not present

## 2021-09-27 DIAGNOSIS — N6489 Other specified disorders of breast: Secondary | ICD-10-CM | POA: Diagnosis not present

## 2021-09-27 DIAGNOSIS — Z171 Estrogen receptor negative status [ER-]: Secondary | ICD-10-CM | POA: Diagnosis not present

## 2021-09-27 DIAGNOSIS — K118 Other diseases of salivary glands: Secondary | ICD-10-CM | POA: Diagnosis not present

## 2021-09-27 DIAGNOSIS — D2272 Melanocytic nevi of left lower limb, including hip: Secondary | ICD-10-CM | POA: Diagnosis not present

## 2021-09-27 DIAGNOSIS — R590 Localized enlarged lymph nodes: Secondary | ICD-10-CM | POA: Diagnosis not present

## 2021-09-27 DIAGNOSIS — D225 Melanocytic nevi of trunk: Secondary | ICD-10-CM | POA: Diagnosis not present

## 2021-10-07 DIAGNOSIS — N898 Other specified noninflammatory disorders of vagina: Secondary | ICD-10-CM | POA: Diagnosis not present

## 2021-10-07 DIAGNOSIS — Z01419 Encounter for gynecological examination (general) (routine) without abnormal findings: Secondary | ICD-10-CM | POA: Diagnosis not present

## 2021-10-11 DIAGNOSIS — D225 Melanocytic nevi of trunk: Secondary | ICD-10-CM | POA: Diagnosis not present

## 2021-10-11 DIAGNOSIS — Z95828 Presence of other vascular implants and grafts: Secondary | ICD-10-CM | POA: Diagnosis not present

## 2021-10-11 DIAGNOSIS — Z5111 Encounter for antineoplastic chemotherapy: Secondary | ICD-10-CM | POA: Diagnosis not present

## 2021-10-11 DIAGNOSIS — Z1501 Genetic susceptibility to malignant neoplasm of breast: Secondary | ICD-10-CM | POA: Diagnosis not present

## 2021-10-11 DIAGNOSIS — N9489 Other specified conditions associated with female genital organs and menstrual cycle: Secondary | ICD-10-CM | POA: Diagnosis not present

## 2021-10-11 DIAGNOSIS — D2272 Melanocytic nevi of left lower limb, including hip: Secondary | ICD-10-CM | POA: Diagnosis not present

## 2021-10-11 DIAGNOSIS — R1909 Other intra-abdominal and pelvic swelling, mass and lump: Secondary | ICD-10-CM | POA: Diagnosis not present

## 2021-10-11 DIAGNOSIS — R5383 Other fatigue: Secondary | ICD-10-CM | POA: Diagnosis not present

## 2021-10-11 DIAGNOSIS — R110 Nausea: Secondary | ICD-10-CM | POA: Diagnosis not present

## 2021-10-11 DIAGNOSIS — R220 Localized swelling, mass and lump, head: Secondary | ICD-10-CM | POA: Diagnosis not present

## 2021-10-11 DIAGNOSIS — Z171 Estrogen receptor negative status [ER-]: Secondary | ICD-10-CM | POA: Diagnosis not present

## 2021-10-11 DIAGNOSIS — M278 Other specified diseases of jaws: Secondary | ICD-10-CM | POA: Diagnosis not present

## 2021-10-11 DIAGNOSIS — G629 Polyneuropathy, unspecified: Secondary | ICD-10-CM | POA: Diagnosis not present

## 2021-10-11 DIAGNOSIS — R935 Abnormal findings on diagnostic imaging of other abdominal regions, including retroperitoneum: Secondary | ICD-10-CM | POA: Diagnosis not present

## 2021-10-11 DIAGNOSIS — D7389 Other diseases of spleen: Secondary | ICD-10-CM | POA: Diagnosis not present

## 2021-10-11 DIAGNOSIS — C50411 Malignant neoplasm of upper-outer quadrant of right female breast: Secondary | ICD-10-CM | POA: Diagnosis not present

## 2021-10-16 DIAGNOSIS — K5730 Diverticulosis of large intestine without perforation or abscess without bleeding: Secondary | ICD-10-CM | POA: Diagnosis not present

## 2021-10-16 DIAGNOSIS — C50911 Malignant neoplasm of unspecified site of right female breast: Secondary | ICD-10-CM | POA: Diagnosis not present

## 2021-10-16 DIAGNOSIS — K760 Fatty (change of) liver, not elsewhere classified: Secondary | ICD-10-CM | POA: Diagnosis not present

## 2021-10-16 DIAGNOSIS — J9811 Atelectasis: Secondary | ICD-10-CM | POA: Diagnosis not present

## 2021-10-18 DIAGNOSIS — D229 Melanocytic nevi, unspecified: Secondary | ICD-10-CM | POA: Diagnosis not present

## 2021-10-18 DIAGNOSIS — D490 Neoplasm of unspecified behavior of digestive system: Secondary | ICD-10-CM | POA: Diagnosis not present

## 2021-10-18 DIAGNOSIS — R110 Nausea: Secondary | ICD-10-CM | POA: Diagnosis not present

## 2021-10-18 DIAGNOSIS — K5900 Constipation, unspecified: Secondary | ICD-10-CM | POA: Diagnosis not present

## 2021-10-18 DIAGNOSIS — Z1501 Genetic susceptibility to malignant neoplasm of breast: Secondary | ICD-10-CM | POA: Diagnosis not present

## 2021-10-18 DIAGNOSIS — D2272 Melanocytic nevi of left lower limb, including hip: Secondary | ICD-10-CM | POA: Diagnosis not present

## 2021-10-18 DIAGNOSIS — R220 Localized swelling, mass and lump, head: Secondary | ICD-10-CM | POA: Diagnosis not present

## 2021-10-18 DIAGNOSIS — D281 Benign neoplasm of vagina: Secondary | ICD-10-CM | POA: Diagnosis not present

## 2021-10-18 DIAGNOSIS — Z95828 Presence of other vascular implants and grafts: Secondary | ICD-10-CM | POA: Diagnosis not present

## 2021-10-18 DIAGNOSIS — G629 Polyneuropathy, unspecified: Secondary | ICD-10-CM | POA: Diagnosis not present

## 2021-10-18 DIAGNOSIS — R5383 Other fatigue: Secondary | ICD-10-CM | POA: Diagnosis not present

## 2021-10-18 DIAGNOSIS — Z5111 Encounter for antineoplastic chemotherapy: Secondary | ICD-10-CM | POA: Diagnosis not present

## 2021-10-18 DIAGNOSIS — R935 Abnormal findings on diagnostic imaging of other abdominal regions, including retroperitoneum: Secondary | ICD-10-CM | POA: Diagnosis not present

## 2021-10-18 DIAGNOSIS — D739 Disease of spleen, unspecified: Secondary | ICD-10-CM | POA: Diagnosis not present

## 2021-10-18 DIAGNOSIS — D367 Benign neoplasm of other specified sites: Secondary | ICD-10-CM | POA: Diagnosis not present

## 2021-10-18 DIAGNOSIS — C50411 Malignant neoplasm of upper-outer quadrant of right female breast: Secondary | ICD-10-CM | POA: Diagnosis not present

## 2021-10-18 DIAGNOSIS — D225 Melanocytic nevi of trunk: Secondary | ICD-10-CM | POA: Diagnosis not present

## 2021-10-18 DIAGNOSIS — Z171 Estrogen receptor negative status [ER-]: Secondary | ICD-10-CM | POA: Diagnosis not present

## 2021-10-22 DIAGNOSIS — R110 Nausea: Secondary | ICD-10-CM | POA: Diagnosis not present

## 2021-10-22 DIAGNOSIS — R42 Dizziness and giddiness: Secondary | ICD-10-CM | POA: Diagnosis not present

## 2021-10-22 DIAGNOSIS — C50411 Malignant neoplasm of upper-outer quadrant of right female breast: Secondary | ICD-10-CM | POA: Diagnosis not present

## 2021-10-22 DIAGNOSIS — Z95828 Presence of other vascular implants and grafts: Secondary | ICD-10-CM | POA: Diagnosis not present

## 2021-10-22 DIAGNOSIS — R000 Tachycardia, unspecified: Secondary | ICD-10-CM | POA: Diagnosis not present

## 2021-10-22 DIAGNOSIS — E860 Dehydration: Secondary | ICD-10-CM | POA: Diagnosis not present

## 2021-10-22 DIAGNOSIS — Z171 Estrogen receptor negative status [ER-]: Secondary | ICD-10-CM | POA: Diagnosis not present

## 2021-11-01 DIAGNOSIS — G629 Polyneuropathy, unspecified: Secondary | ICD-10-CM | POA: Diagnosis not present

## 2021-11-01 DIAGNOSIS — R110 Nausea: Secondary | ICD-10-CM | POA: Diagnosis not present

## 2021-11-01 DIAGNOSIS — C50411 Malignant neoplasm of upper-outer quadrant of right female breast: Secondary | ICD-10-CM | POA: Diagnosis not present

## 2021-11-01 DIAGNOSIS — K5900 Constipation, unspecified: Secondary | ICD-10-CM | POA: Diagnosis not present

## 2021-11-01 DIAGNOSIS — Z1501 Genetic susceptibility to malignant neoplasm of breast: Secondary | ICD-10-CM | POA: Diagnosis not present

## 2021-11-01 DIAGNOSIS — D2272 Melanocytic nevi of left lower limb, including hip: Secondary | ICD-10-CM | POA: Diagnosis not present

## 2021-11-01 DIAGNOSIS — Z95828 Presence of other vascular implants and grafts: Secondary | ICD-10-CM | POA: Diagnosis not present

## 2021-11-01 DIAGNOSIS — R220 Localized swelling, mass and lump, head: Secondary | ICD-10-CM | POA: Diagnosis not present

## 2021-11-01 DIAGNOSIS — R935 Abnormal findings on diagnostic imaging of other abdominal regions, including retroperitoneum: Secondary | ICD-10-CM | POA: Diagnosis not present

## 2021-11-01 DIAGNOSIS — K118 Other diseases of salivary glands: Secondary | ICD-10-CM | POA: Diagnosis not present

## 2021-11-01 DIAGNOSIS — D225 Melanocytic nevi of trunk: Secondary | ICD-10-CM | POA: Diagnosis not present

## 2021-11-01 DIAGNOSIS — D367 Benign neoplasm of other specified sites: Secondary | ICD-10-CM | POA: Diagnosis not present

## 2021-11-01 DIAGNOSIS — Z171 Estrogen receptor negative status [ER-]: Secondary | ICD-10-CM | POA: Diagnosis not present

## 2021-11-01 DIAGNOSIS — R040 Epistaxis: Secondary | ICD-10-CM | POA: Diagnosis not present

## 2021-11-01 DIAGNOSIS — R5383 Other fatigue: Secondary | ICD-10-CM | POA: Diagnosis not present

## 2021-11-08 DIAGNOSIS — C50411 Malignant neoplasm of upper-outer quadrant of right female breast: Secondary | ICD-10-CM | POA: Diagnosis not present

## 2021-11-08 DIAGNOSIS — Z171 Estrogen receptor negative status [ER-]: Secondary | ICD-10-CM | POA: Diagnosis not present

## 2021-11-08 DIAGNOSIS — Z95828 Presence of other vascular implants and grafts: Secondary | ICD-10-CM | POA: Diagnosis not present

## 2021-11-08 DIAGNOSIS — Z5111 Encounter for antineoplastic chemotherapy: Secondary | ICD-10-CM | POA: Diagnosis not present

## 2021-11-15 DIAGNOSIS — D281 Benign neoplasm of vagina: Secondary | ICD-10-CM | POA: Diagnosis not present

## 2021-11-15 DIAGNOSIS — D7389 Other diseases of spleen: Secondary | ICD-10-CM | POA: Diagnosis not present

## 2021-11-15 DIAGNOSIS — Z5112 Encounter for antineoplastic immunotherapy: Secondary | ICD-10-CM | POA: Diagnosis not present

## 2021-11-15 DIAGNOSIS — D2272 Melanocytic nevi of left lower limb, including hip: Secondary | ICD-10-CM | POA: Diagnosis not present

## 2021-11-15 DIAGNOSIS — Z5111 Encounter for antineoplastic chemotherapy: Secondary | ICD-10-CM | POA: Diagnosis not present

## 2021-11-15 DIAGNOSIS — C50411 Malignant neoplasm of upper-outer quadrant of right female breast: Secondary | ICD-10-CM | POA: Diagnosis not present

## 2021-11-15 DIAGNOSIS — Z171 Estrogen receptor negative status [ER-]: Secondary | ICD-10-CM | POA: Diagnosis not present

## 2021-11-15 DIAGNOSIS — R7401 Elevation of levels of liver transaminase levels: Secondary | ICD-10-CM | POA: Diagnosis not present

## 2021-11-15 DIAGNOSIS — D110 Benign neoplasm of parotid gland: Secondary | ICD-10-CM | POA: Diagnosis not present

## 2021-11-15 DIAGNOSIS — K5900 Constipation, unspecified: Secondary | ICD-10-CM | POA: Diagnosis not present

## 2021-11-22 DIAGNOSIS — Z171 Estrogen receptor negative status [ER-]: Secondary | ICD-10-CM | POA: Diagnosis not present

## 2021-11-22 DIAGNOSIS — Z9221 Personal history of antineoplastic chemotherapy: Secondary | ICD-10-CM | POA: Diagnosis not present

## 2021-11-22 DIAGNOSIS — D2272 Melanocytic nevi of left lower limb, including hip: Secondary | ICD-10-CM | POA: Diagnosis not present

## 2021-11-22 DIAGNOSIS — K5900 Constipation, unspecified: Secondary | ICD-10-CM | POA: Diagnosis not present

## 2021-11-22 DIAGNOSIS — R229 Localized swelling, mass and lump, unspecified: Secondary | ICD-10-CM | POA: Diagnosis not present

## 2021-11-22 DIAGNOSIS — T50905A Adverse effect of unspecified drugs, medicaments and biological substances, initial encounter: Secondary | ICD-10-CM | POA: Diagnosis not present

## 2021-11-22 DIAGNOSIS — G629 Polyneuropathy, unspecified: Secondary | ICD-10-CM | POA: Diagnosis not present

## 2021-11-22 DIAGNOSIS — T451X5A Adverse effect of antineoplastic and immunosuppressive drugs, initial encounter: Secondary | ICD-10-CM | POA: Diagnosis not present

## 2021-11-22 DIAGNOSIS — D281 Benign neoplasm of vagina: Secondary | ICD-10-CM | POA: Diagnosis not present

## 2021-11-22 DIAGNOSIS — C50411 Malignant neoplasm of upper-outer quadrant of right female breast: Secondary | ICD-10-CM | POA: Diagnosis not present

## 2021-11-22 DIAGNOSIS — D110 Benign neoplasm of parotid gland: Secondary | ICD-10-CM | POA: Diagnosis not present

## 2021-11-22 DIAGNOSIS — E064 Drug-induced thyroiditis: Secondary | ICD-10-CM | POA: Diagnosis not present

## 2021-11-22 DIAGNOSIS — R7401 Elevation of levels of liver transaminase levels: Secondary | ICD-10-CM | POA: Diagnosis not present

## 2021-11-22 DIAGNOSIS — D7389 Other diseases of spleen: Secondary | ICD-10-CM | POA: Diagnosis not present

## 2021-11-22 DIAGNOSIS — Y929 Unspecified place or not applicable: Secondary | ICD-10-CM | POA: Diagnosis not present

## 2021-11-22 DIAGNOSIS — K716 Toxic liver disease with hepatitis, not elsewhere classified: Secondary | ICD-10-CM | POA: Diagnosis not present

## 2021-11-28 DIAGNOSIS — E1165 Type 2 diabetes mellitus with hyperglycemia: Secondary | ICD-10-CM | POA: Diagnosis not present

## 2021-11-28 DIAGNOSIS — E064 Drug-induced thyroiditis: Secondary | ICD-10-CM | POA: Diagnosis not present

## 2021-11-29 DIAGNOSIS — R220 Localized swelling, mass and lump, head: Secondary | ICD-10-CM | POA: Diagnosis not present

## 2021-11-29 DIAGNOSIS — D37030 Neoplasm of uncertain behavior of the parotid salivary glands: Secondary | ICD-10-CM | POA: Diagnosis not present

## 2021-11-29 DIAGNOSIS — E1165 Type 2 diabetes mellitus with hyperglycemia: Secondary | ICD-10-CM | POA: Diagnosis not present

## 2021-11-29 DIAGNOSIS — R110 Nausea: Secondary | ICD-10-CM | POA: Diagnosis not present

## 2021-11-29 DIAGNOSIS — R000 Tachycardia, unspecified: Secondary | ICD-10-CM | POA: Diagnosis not present

## 2021-11-29 DIAGNOSIS — Z794 Long term (current) use of insulin: Secondary | ICD-10-CM | POA: Diagnosis not present

## 2021-11-29 DIAGNOSIS — R5383 Other fatigue: Secondary | ICD-10-CM | POA: Diagnosis not present

## 2021-11-29 DIAGNOSIS — C50411 Malignant neoplasm of upper-outer quadrant of right female breast: Secondary | ICD-10-CM | POA: Diagnosis not present

## 2021-11-29 DIAGNOSIS — Z1501 Genetic susceptibility to malignant neoplasm of breast: Secondary | ICD-10-CM | POA: Diagnosis not present

## 2021-11-29 DIAGNOSIS — D225 Melanocytic nevi of trunk: Secondary | ICD-10-CM | POA: Diagnosis not present

## 2021-11-29 DIAGNOSIS — R7989 Other specified abnormal findings of blood chemistry: Secondary | ICD-10-CM | POA: Diagnosis not present

## 2021-11-29 DIAGNOSIS — E0965 Drug or chemical induced diabetes mellitus with hyperglycemia: Secondary | ICD-10-CM | POA: Diagnosis not present

## 2021-11-29 DIAGNOSIS — Z7952 Long term (current) use of systemic steroids: Secondary | ICD-10-CM | POA: Diagnosis not present

## 2021-11-29 DIAGNOSIS — Z79899 Other long term (current) drug therapy: Secondary | ICD-10-CM | POA: Diagnosis not present

## 2021-11-29 DIAGNOSIS — T50905A Adverse effect of unspecified drugs, medicaments and biological substances, initial encounter: Secondary | ICD-10-CM | POA: Diagnosis not present

## 2021-11-29 DIAGNOSIS — E064 Drug-induced thyroiditis: Secondary | ICD-10-CM | POA: Diagnosis not present

## 2021-11-29 DIAGNOSIS — F17200 Nicotine dependence, unspecified, uncomplicated: Secondary | ICD-10-CM | POA: Diagnosis not present

## 2021-11-29 DIAGNOSIS — Z95828 Presence of other vascular implants and grafts: Secondary | ICD-10-CM | POA: Diagnosis not present

## 2021-11-29 DIAGNOSIS — K5900 Constipation, unspecified: Secondary | ICD-10-CM | POA: Diagnosis not present

## 2021-11-29 DIAGNOSIS — D2272 Melanocytic nevi of left lower limb, including hip: Secondary | ICD-10-CM | POA: Diagnosis not present

## 2021-11-29 DIAGNOSIS — R935 Abnormal findings on diagnostic imaging of other abdominal regions, including retroperitoneum: Secondary | ICD-10-CM | POA: Diagnosis not present

## 2021-11-29 DIAGNOSIS — Z171 Estrogen receptor negative status [ER-]: Secondary | ICD-10-CM | POA: Diagnosis not present

## 2021-11-29 DIAGNOSIS — K118 Other diseases of salivary glands: Secondary | ICD-10-CM | POA: Diagnosis not present

## 2021-11-29 DIAGNOSIS — G629 Polyneuropathy, unspecified: Secondary | ICD-10-CM | POA: Diagnosis not present

## 2021-11-29 DIAGNOSIS — Z5111 Encounter for antineoplastic chemotherapy: Secondary | ICD-10-CM | POA: Diagnosis not present

## 2021-12-06 DIAGNOSIS — F17200 Nicotine dependence, unspecified, uncomplicated: Secondary | ICD-10-CM | POA: Diagnosis not present

## 2021-12-06 DIAGNOSIS — K5900 Constipation, unspecified: Secondary | ICD-10-CM | POA: Diagnosis not present

## 2021-12-06 DIAGNOSIS — R220 Localized swelling, mass and lump, head: Secondary | ICD-10-CM | POA: Diagnosis not present

## 2021-12-06 DIAGNOSIS — R000 Tachycardia, unspecified: Secondary | ICD-10-CM | POA: Diagnosis not present

## 2021-12-06 DIAGNOSIS — Z7952 Long term (current) use of systemic steroids: Secondary | ICD-10-CM | POA: Diagnosis not present

## 2021-12-06 DIAGNOSIS — D225 Melanocytic nevi of trunk: Secondary | ICD-10-CM | POA: Diagnosis not present

## 2021-12-06 DIAGNOSIS — Z79899 Other long term (current) drug therapy: Secondary | ICD-10-CM | POA: Diagnosis not present

## 2021-12-06 DIAGNOSIS — K118 Other diseases of salivary glands: Secondary | ICD-10-CM | POA: Diagnosis not present

## 2021-12-06 DIAGNOSIS — Z5111 Encounter for antineoplastic chemotherapy: Secondary | ICD-10-CM | POA: Diagnosis not present

## 2021-12-06 DIAGNOSIS — E1165 Type 2 diabetes mellitus with hyperglycemia: Secondary | ICD-10-CM | POA: Diagnosis not present

## 2021-12-06 DIAGNOSIS — Z7985 Long-term (current) use of injectable non-insulin antidiabetic drugs: Secondary | ICD-10-CM | POA: Diagnosis not present

## 2021-12-06 DIAGNOSIS — E1140 Type 2 diabetes mellitus with diabetic neuropathy, unspecified: Secondary | ICD-10-CM | POA: Diagnosis not present

## 2021-12-06 DIAGNOSIS — Z171 Estrogen receptor negative status [ER-]: Secondary | ICD-10-CM | POA: Diagnosis not present

## 2021-12-06 DIAGNOSIS — R935 Abnormal findings on diagnostic imaging of other abdominal regions, including retroperitoneum: Secondary | ICD-10-CM | POA: Diagnosis not present

## 2021-12-06 DIAGNOSIS — Z794 Long term (current) use of insulin: Secondary | ICD-10-CM | POA: Diagnosis not present

## 2021-12-06 DIAGNOSIS — C50411 Malignant neoplasm of upper-outer quadrant of right female breast: Secondary | ICD-10-CM | POA: Diagnosis not present

## 2021-12-06 DIAGNOSIS — Z95828 Presence of other vascular implants and grafts: Secondary | ICD-10-CM | POA: Diagnosis not present

## 2021-12-06 DIAGNOSIS — T50905A Adverse effect of unspecified drugs, medicaments and biological substances, initial encounter: Secondary | ICD-10-CM | POA: Diagnosis not present

## 2021-12-06 DIAGNOSIS — R5383 Other fatigue: Secondary | ICD-10-CM | POA: Diagnosis not present

## 2021-12-06 DIAGNOSIS — R110 Nausea: Secondary | ICD-10-CM | POA: Diagnosis not present

## 2021-12-06 DIAGNOSIS — Z1501 Genetic susceptibility to malignant neoplasm of breast: Secondary | ICD-10-CM | POA: Diagnosis not present

## 2021-12-06 DIAGNOSIS — D2272 Melanocytic nevi of left lower limb, including hip: Secondary | ICD-10-CM | POA: Diagnosis not present

## 2021-12-06 DIAGNOSIS — E064 Drug-induced thyroiditis: Secondary | ICD-10-CM | POA: Diagnosis not present

## 2021-12-13 DIAGNOSIS — Z7985 Long-term (current) use of injectable non-insulin antidiabetic drugs: Secondary | ICD-10-CM | POA: Diagnosis not present

## 2021-12-13 DIAGNOSIS — K5900 Constipation, unspecified: Secondary | ICD-10-CM | POA: Diagnosis not present

## 2021-12-13 DIAGNOSIS — Z87891 Personal history of nicotine dependence: Secondary | ICD-10-CM | POA: Diagnosis not present

## 2021-12-13 DIAGNOSIS — E876 Hypokalemia: Secondary | ICD-10-CM | POA: Diagnosis not present

## 2021-12-13 DIAGNOSIS — Z794 Long term (current) use of insulin: Secondary | ICD-10-CM | POA: Diagnosis not present

## 2021-12-13 DIAGNOSIS — E1165 Type 2 diabetes mellitus with hyperglycemia: Secondary | ICD-10-CM | POA: Diagnosis not present

## 2021-12-13 DIAGNOSIS — K118 Other diseases of salivary glands: Secondary | ICD-10-CM | POA: Diagnosis not present

## 2021-12-13 DIAGNOSIS — T50905D Adverse effect of unspecified drugs, medicaments and biological substances, subsequent encounter: Secondary | ICD-10-CM | POA: Diagnosis not present

## 2021-12-13 DIAGNOSIS — R5383 Other fatigue: Secondary | ICD-10-CM | POA: Diagnosis not present

## 2021-12-13 DIAGNOSIS — Z79899 Other long term (current) drug therapy: Secondary | ICD-10-CM | POA: Diagnosis not present

## 2021-12-13 DIAGNOSIS — Z7952 Long term (current) use of systemic steroids: Secondary | ICD-10-CM | POA: Diagnosis not present

## 2021-12-13 DIAGNOSIS — E860 Dehydration: Secondary | ICD-10-CM | POA: Diagnosis not present

## 2021-12-13 DIAGNOSIS — R000 Tachycardia, unspecified: Secondary | ICD-10-CM | POA: Diagnosis not present

## 2021-12-13 DIAGNOSIS — Z79630 Long term (current) use of alkylating agent: Secondary | ICD-10-CM | POA: Diagnosis not present

## 2021-12-13 DIAGNOSIS — E064 Drug-induced thyroiditis: Secondary | ICD-10-CM | POA: Diagnosis not present

## 2021-12-13 DIAGNOSIS — Z5111 Encounter for antineoplastic chemotherapy: Secondary | ICD-10-CM | POA: Diagnosis not present

## 2021-12-13 DIAGNOSIS — K716 Toxic liver disease with hepatitis, not elsewhere classified: Secondary | ICD-10-CM | POA: Diagnosis not present

## 2021-12-13 DIAGNOSIS — E1140 Type 2 diabetes mellitus with diabetic neuropathy, unspecified: Secondary | ICD-10-CM | POA: Diagnosis not present

## 2021-12-13 DIAGNOSIS — Z79633 Long term (current) use of mitotic inhibitor: Secondary | ICD-10-CM | POA: Diagnosis not present

## 2021-12-13 DIAGNOSIS — E0965 Drug or chemical induced diabetes mellitus with hyperglycemia: Secondary | ICD-10-CM | POA: Diagnosis not present

## 2021-12-13 DIAGNOSIS — C50411 Malignant neoplasm of upper-outer quadrant of right female breast: Secondary | ICD-10-CM | POA: Diagnosis not present

## 2021-12-13 DIAGNOSIS — Z171 Estrogen receptor negative status [ER-]: Secondary | ICD-10-CM | POA: Diagnosis not present

## 2021-12-13 DIAGNOSIS — Z95828 Presence of other vascular implants and grafts: Secondary | ICD-10-CM | POA: Diagnosis not present

## 2021-12-20 DIAGNOSIS — E1165 Type 2 diabetes mellitus with hyperglycemia: Secondary | ICD-10-CM | POA: Diagnosis not present

## 2021-12-20 DIAGNOSIS — R000 Tachycardia, unspecified: Secondary | ICD-10-CM | POA: Diagnosis not present

## 2021-12-20 DIAGNOSIS — K716 Toxic liver disease with hepatitis, not elsewhere classified: Secondary | ICD-10-CM | POA: Diagnosis not present

## 2021-12-20 DIAGNOSIS — Z86018 Personal history of other benign neoplasm: Secondary | ICD-10-CM | POA: Diagnosis not present

## 2021-12-20 DIAGNOSIS — Z7985 Long-term (current) use of injectable non-insulin antidiabetic drugs: Secondary | ICD-10-CM | POA: Diagnosis not present

## 2021-12-20 DIAGNOSIS — R5383 Other fatigue: Secondary | ICD-10-CM | POA: Diagnosis not present

## 2021-12-20 DIAGNOSIS — E1140 Type 2 diabetes mellitus with diabetic neuropathy, unspecified: Secondary | ICD-10-CM | POA: Diagnosis not present

## 2021-12-20 DIAGNOSIS — Z171 Estrogen receptor negative status [ER-]: Secondary | ICD-10-CM | POA: Diagnosis not present

## 2021-12-20 DIAGNOSIS — E0965 Drug or chemical induced diabetes mellitus with hyperglycemia: Secondary | ICD-10-CM | POA: Diagnosis not present

## 2021-12-20 DIAGNOSIS — T50905D Adverse effect of unspecified drugs, medicaments and biological substances, subsequent encounter: Secondary | ICD-10-CM | POA: Diagnosis not present

## 2021-12-20 DIAGNOSIS — K5900 Constipation, unspecified: Secondary | ICD-10-CM | POA: Diagnosis not present

## 2021-12-20 DIAGNOSIS — Z7952 Long term (current) use of systemic steroids: Secondary | ICD-10-CM | POA: Diagnosis not present

## 2021-12-20 DIAGNOSIS — C50411 Malignant neoplasm of upper-outer quadrant of right female breast: Secondary | ICD-10-CM | POA: Diagnosis not present

## 2021-12-20 DIAGNOSIS — E876 Hypokalemia: Secondary | ICD-10-CM | POA: Diagnosis not present

## 2021-12-20 DIAGNOSIS — Z79630 Long term (current) use of alkylating agent: Secondary | ICD-10-CM | POA: Diagnosis not present

## 2021-12-20 DIAGNOSIS — Z5111 Encounter for antineoplastic chemotherapy: Secondary | ICD-10-CM | POA: Diagnosis not present

## 2021-12-20 DIAGNOSIS — K118 Other diseases of salivary glands: Secondary | ICD-10-CM | POA: Diagnosis not present

## 2021-12-20 DIAGNOSIS — Z87891 Personal history of nicotine dependence: Secondary | ICD-10-CM | POA: Diagnosis not present

## 2021-12-20 DIAGNOSIS — Z95828 Presence of other vascular implants and grafts: Secondary | ICD-10-CM | POA: Diagnosis not present

## 2021-12-20 DIAGNOSIS — R002 Palpitations: Secondary | ICD-10-CM | POA: Diagnosis not present

## 2021-12-20 DIAGNOSIS — Z79633 Long term (current) use of mitotic inhibitor: Secondary | ICD-10-CM | POA: Diagnosis not present

## 2021-12-20 DIAGNOSIS — Z794 Long term (current) use of insulin: Secondary | ICD-10-CM | POA: Diagnosis not present

## 2021-12-20 DIAGNOSIS — E0590 Thyrotoxicosis, unspecified without thyrotoxic crisis or storm: Secondary | ICD-10-CM | POA: Diagnosis not present

## 2021-12-20 DIAGNOSIS — Z79899 Other long term (current) drug therapy: Secondary | ICD-10-CM | POA: Diagnosis not present

## 2021-12-20 DIAGNOSIS — E064 Drug-induced thyroiditis: Secondary | ICD-10-CM | POA: Diagnosis not present

## 2021-12-27 DIAGNOSIS — Z95828 Presence of other vascular implants and grafts: Secondary | ICD-10-CM | POA: Diagnosis not present

## 2021-12-27 DIAGNOSIS — C50411 Malignant neoplasm of upper-outer quadrant of right female breast: Secondary | ICD-10-CM | POA: Diagnosis not present

## 2021-12-27 DIAGNOSIS — Z79630 Long term (current) use of alkylating agent: Secondary | ICD-10-CM | POA: Diagnosis not present

## 2021-12-27 DIAGNOSIS — K5900 Constipation, unspecified: Secondary | ICD-10-CM | POA: Diagnosis not present

## 2021-12-27 DIAGNOSIS — E039 Hypothyroidism, unspecified: Secondary | ICD-10-CM | POA: Diagnosis not present

## 2021-12-27 DIAGNOSIS — R220 Localized swelling, mass and lump, head: Secondary | ICD-10-CM | POA: Diagnosis not present

## 2021-12-27 DIAGNOSIS — Z87891 Personal history of nicotine dependence: Secondary | ICD-10-CM | POA: Diagnosis not present

## 2021-12-27 DIAGNOSIS — E0965 Drug or chemical induced diabetes mellitus with hyperglycemia: Secondary | ICD-10-CM | POA: Diagnosis not present

## 2021-12-27 DIAGNOSIS — K716 Toxic liver disease with hepatitis, not elsewhere classified: Secondary | ICD-10-CM | POA: Diagnosis not present

## 2021-12-27 DIAGNOSIS — Z7985 Long-term (current) use of injectable non-insulin antidiabetic drugs: Secondary | ICD-10-CM | POA: Diagnosis not present

## 2021-12-27 DIAGNOSIS — E1165 Type 2 diabetes mellitus with hyperglycemia: Secondary | ICD-10-CM | POA: Diagnosis not present

## 2021-12-27 DIAGNOSIS — Z79633 Long term (current) use of mitotic inhibitor: Secondary | ICD-10-CM | POA: Diagnosis not present

## 2021-12-27 DIAGNOSIS — K118 Other diseases of salivary glands: Secondary | ICD-10-CM | POA: Diagnosis not present

## 2021-12-27 DIAGNOSIS — E876 Hypokalemia: Secondary | ICD-10-CM | POA: Diagnosis not present

## 2021-12-27 DIAGNOSIS — R5383 Other fatigue: Secondary | ICD-10-CM | POA: Diagnosis not present

## 2021-12-27 DIAGNOSIS — E069 Thyroiditis, unspecified: Secondary | ICD-10-CM | POA: Diagnosis not present

## 2021-12-27 DIAGNOSIS — E1140 Type 2 diabetes mellitus with diabetic neuropathy, unspecified: Secondary | ICD-10-CM | POA: Diagnosis not present

## 2021-12-27 DIAGNOSIS — T50905D Adverse effect of unspecified drugs, medicaments and biological substances, subsequent encounter: Secondary | ICD-10-CM | POA: Diagnosis not present

## 2021-12-27 DIAGNOSIS — E064 Drug-induced thyroiditis: Secondary | ICD-10-CM | POA: Diagnosis not present

## 2021-12-27 DIAGNOSIS — Z171 Estrogen receptor negative status [ER-]: Secondary | ICD-10-CM | POA: Diagnosis not present

## 2021-12-27 DIAGNOSIS — Z794 Long term (current) use of insulin: Secondary | ICD-10-CM | POA: Diagnosis not present

## 2021-12-27 DIAGNOSIS — Z79899 Other long term (current) drug therapy: Secondary | ICD-10-CM | POA: Diagnosis not present

## 2021-12-27 DIAGNOSIS — Z7952 Long term (current) use of systemic steroids: Secondary | ICD-10-CM | POA: Diagnosis not present

## 2021-12-27 DIAGNOSIS — Z5111 Encounter for antineoplastic chemotherapy: Secondary | ICD-10-CM | POA: Diagnosis not present

## 2022-01-10 DIAGNOSIS — Z7952 Long term (current) use of systemic steroids: Secondary | ICD-10-CM | POA: Diagnosis not present

## 2022-01-10 DIAGNOSIS — Z87891 Personal history of nicotine dependence: Secondary | ICD-10-CM | POA: Diagnosis not present

## 2022-01-10 DIAGNOSIS — C50411 Malignant neoplasm of upper-outer quadrant of right female breast: Secondary | ICD-10-CM | POA: Diagnosis not present

## 2022-01-10 DIAGNOSIS — Z7985 Long-term (current) use of injectable non-insulin antidiabetic drugs: Secondary | ICD-10-CM | POA: Diagnosis not present

## 2022-01-10 DIAGNOSIS — E1140 Type 2 diabetes mellitus with diabetic neuropathy, unspecified: Secondary | ICD-10-CM | POA: Diagnosis not present

## 2022-01-10 DIAGNOSIS — T50905D Adverse effect of unspecified drugs, medicaments and biological substances, subsequent encounter: Secondary | ICD-10-CM | POA: Diagnosis not present

## 2022-01-10 DIAGNOSIS — Z79633 Long term (current) use of mitotic inhibitor: Secondary | ICD-10-CM | POA: Diagnosis not present

## 2022-01-10 DIAGNOSIS — E1165 Type 2 diabetes mellitus with hyperglycemia: Secondary | ICD-10-CM | POA: Diagnosis not present

## 2022-01-10 DIAGNOSIS — D367 Benign neoplasm of other specified sites: Secondary | ICD-10-CM | POA: Diagnosis not present

## 2022-01-10 DIAGNOSIS — E876 Hypokalemia: Secondary | ICD-10-CM | POA: Diagnosis not present

## 2022-01-10 DIAGNOSIS — Z794 Long term (current) use of insulin: Secondary | ICD-10-CM | POA: Diagnosis not present

## 2022-01-10 DIAGNOSIS — K716 Toxic liver disease with hepatitis, not elsewhere classified: Secondary | ICD-10-CM | POA: Diagnosis not present

## 2022-01-10 DIAGNOSIS — G629 Polyneuropathy, unspecified: Secondary | ICD-10-CM | POA: Diagnosis not present

## 2022-01-10 DIAGNOSIS — E0965 Drug or chemical induced diabetes mellitus with hyperglycemia: Secondary | ICD-10-CM | POA: Diagnosis not present

## 2022-01-10 DIAGNOSIS — E039 Hypothyroidism, unspecified: Secondary | ICD-10-CM | POA: Diagnosis not present

## 2022-01-10 DIAGNOSIS — Z171 Estrogen receptor negative status [ER-]: Secondary | ICD-10-CM | POA: Diagnosis not present

## 2022-01-10 DIAGNOSIS — R5383 Other fatigue: Secondary | ICD-10-CM | POA: Diagnosis not present

## 2022-01-10 DIAGNOSIS — Z79899 Other long term (current) drug therapy: Secondary | ICD-10-CM | POA: Diagnosis not present

## 2022-01-10 DIAGNOSIS — Z5111 Encounter for antineoplastic chemotherapy: Secondary | ICD-10-CM | POA: Diagnosis not present

## 2022-01-10 DIAGNOSIS — K118 Other diseases of salivary glands: Secondary | ICD-10-CM | POA: Diagnosis not present

## 2022-01-10 DIAGNOSIS — K5900 Constipation, unspecified: Secondary | ICD-10-CM | POA: Diagnosis not present

## 2022-01-10 DIAGNOSIS — R9389 Abnormal findings on diagnostic imaging of other specified body structures: Secondary | ICD-10-CM | POA: Diagnosis not present

## 2022-01-10 DIAGNOSIS — E064 Drug-induced thyroiditis: Secondary | ICD-10-CM | POA: Diagnosis not present

## 2022-01-10 DIAGNOSIS — Z95828 Presence of other vascular implants and grafts: Secondary | ICD-10-CM | POA: Diagnosis not present

## 2022-01-10 DIAGNOSIS — Z79630 Long term (current) use of alkylating agent: Secondary | ICD-10-CM | POA: Diagnosis not present

## 2022-01-12 DIAGNOSIS — C50411 Malignant neoplasm of upper-outer quadrant of right female breast: Secondary | ICD-10-CM | POA: Diagnosis not present

## 2022-01-12 DIAGNOSIS — Z7689 Persons encountering health services in other specified circumstances: Secondary | ICD-10-CM | POA: Diagnosis not present

## 2022-01-12 DIAGNOSIS — Z171 Estrogen receptor negative status [ER-]: Secondary | ICD-10-CM | POA: Diagnosis not present

## 2022-01-24 DIAGNOSIS — Z5111 Encounter for antineoplastic chemotherapy: Secondary | ICD-10-CM | POA: Diagnosis not present

## 2022-01-24 DIAGNOSIS — T50905D Adverse effect of unspecified drugs, medicaments and biological substances, subsequent encounter: Secondary | ICD-10-CM | POA: Diagnosis not present

## 2022-01-24 DIAGNOSIS — Z171 Estrogen receptor negative status [ER-]: Secondary | ICD-10-CM | POA: Diagnosis not present

## 2022-01-24 DIAGNOSIS — Z79633 Long term (current) use of mitotic inhibitor: Secondary | ICD-10-CM | POA: Diagnosis not present

## 2022-01-24 DIAGNOSIS — Z79890 Hormone replacement therapy (postmenopausal): Secondary | ICD-10-CM | POA: Diagnosis not present

## 2022-01-24 DIAGNOSIS — Z794 Long term (current) use of insulin: Secondary | ICD-10-CM | POA: Diagnosis not present

## 2022-01-24 DIAGNOSIS — E1140 Type 2 diabetes mellitus with diabetic neuropathy, unspecified: Secondary | ICD-10-CM | POA: Diagnosis not present

## 2022-01-24 DIAGNOSIS — E1165 Type 2 diabetes mellitus with hyperglycemia: Secondary | ICD-10-CM | POA: Diagnosis not present

## 2022-01-24 DIAGNOSIS — E860 Dehydration: Secondary | ICD-10-CM | POA: Diagnosis not present

## 2022-01-24 DIAGNOSIS — Z79899 Other long term (current) drug therapy: Secondary | ICD-10-CM | POA: Diagnosis not present

## 2022-01-24 DIAGNOSIS — Z95828 Presence of other vascular implants and grafts: Secondary | ICD-10-CM | POA: Diagnosis not present

## 2022-01-24 DIAGNOSIS — G629 Polyneuropathy, unspecified: Secondary | ICD-10-CM | POA: Diagnosis not present

## 2022-01-24 DIAGNOSIS — Z79630 Long term (current) use of alkylating agent: Secondary | ICD-10-CM | POA: Diagnosis not present

## 2022-01-24 DIAGNOSIS — K716 Toxic liver disease with hepatitis, not elsewhere classified: Secondary | ICD-10-CM | POA: Diagnosis not present

## 2022-01-24 DIAGNOSIS — Z87891 Personal history of nicotine dependence: Secondary | ICD-10-CM | POA: Diagnosis not present

## 2022-01-24 DIAGNOSIS — L0292 Furuncle, unspecified: Secondary | ICD-10-CM | POA: Diagnosis not present

## 2022-01-24 DIAGNOSIS — K118 Other diseases of salivary glands: Secondary | ICD-10-CM | POA: Diagnosis not present

## 2022-01-24 DIAGNOSIS — E876 Hypokalemia: Secondary | ICD-10-CM | POA: Diagnosis not present

## 2022-01-24 DIAGNOSIS — Z7985 Long-term (current) use of injectable non-insulin antidiabetic drugs: Secondary | ICD-10-CM | POA: Diagnosis not present

## 2022-01-24 DIAGNOSIS — C50411 Malignant neoplasm of upper-outer quadrant of right female breast: Secondary | ICD-10-CM | POA: Diagnosis not present

## 2022-01-24 DIAGNOSIS — R5383 Other fatigue: Secondary | ICD-10-CM | POA: Diagnosis not present

## 2022-01-24 DIAGNOSIS — E064 Drug-induced thyroiditis: Secondary | ICD-10-CM | POA: Diagnosis not present

## 2022-01-24 DIAGNOSIS — E039 Hypothyroidism, unspecified: Secondary | ICD-10-CM | POA: Diagnosis not present

## 2022-01-24 DIAGNOSIS — D165 Benign neoplasm of lower jaw bone: Secondary | ICD-10-CM | POA: Diagnosis not present

## 2022-01-24 DIAGNOSIS — K5900 Constipation, unspecified: Secondary | ICD-10-CM | POA: Diagnosis not present

## 2022-01-24 DIAGNOSIS — Z7952 Long term (current) use of systemic steroids: Secondary | ICD-10-CM | POA: Diagnosis not present

## 2022-01-26 DIAGNOSIS — C50411 Malignant neoplasm of upper-outer quadrant of right female breast: Secondary | ICD-10-CM | POA: Diagnosis not present

## 2022-01-26 DIAGNOSIS — Z7689 Persons encountering health services in other specified circumstances: Secondary | ICD-10-CM | POA: Diagnosis not present

## 2022-01-26 DIAGNOSIS — Z171 Estrogen receptor negative status [ER-]: Secondary | ICD-10-CM | POA: Diagnosis not present

## 2022-02-07 DIAGNOSIS — R002 Palpitations: Secondary | ICD-10-CM | POA: Diagnosis not present

## 2022-02-07 DIAGNOSIS — Z79890 Hormone replacement therapy (postmenopausal): Secondary | ICD-10-CM | POA: Diagnosis not present

## 2022-02-07 DIAGNOSIS — K7589 Other specified inflammatory liver diseases: Secondary | ICD-10-CM | POA: Diagnosis not present

## 2022-02-07 DIAGNOSIS — L0292 Furuncle, unspecified: Secondary | ICD-10-CM | POA: Diagnosis not present

## 2022-02-07 DIAGNOSIS — T451X5D Adverse effect of antineoplastic and immunosuppressive drugs, subsequent encounter: Secondary | ICD-10-CM | POA: Diagnosis not present

## 2022-02-07 DIAGNOSIS — D6481 Anemia due to antineoplastic chemotherapy: Secondary | ICD-10-CM | POA: Diagnosis not present

## 2022-02-07 DIAGNOSIS — E876 Hypokalemia: Secondary | ICD-10-CM | POA: Diagnosis not present

## 2022-02-07 DIAGNOSIS — D165 Benign neoplasm of lower jaw bone: Secondary | ICD-10-CM | POA: Diagnosis not present

## 2022-02-07 DIAGNOSIS — E064 Drug-induced thyroiditis: Secondary | ICD-10-CM | POA: Diagnosis not present

## 2022-02-07 DIAGNOSIS — E1140 Type 2 diabetes mellitus with diabetic neuropathy, unspecified: Secondary | ICD-10-CM | POA: Diagnosis not present

## 2022-02-07 DIAGNOSIS — C50411 Malignant neoplasm of upper-outer quadrant of right female breast: Secondary | ICD-10-CM | POA: Diagnosis not present

## 2022-02-07 DIAGNOSIS — R110 Nausea: Secondary | ICD-10-CM | POA: Diagnosis not present

## 2022-02-07 DIAGNOSIS — E039 Hypothyroidism, unspecified: Secondary | ICD-10-CM | POA: Diagnosis not present

## 2022-02-07 DIAGNOSIS — F17200 Nicotine dependence, unspecified, uncomplicated: Secondary | ICD-10-CM | POA: Diagnosis not present

## 2022-02-07 DIAGNOSIS — R7401 Elevation of levels of liver transaminase levels: Secondary | ICD-10-CM | POA: Diagnosis not present

## 2022-02-07 DIAGNOSIS — R5383 Other fatigue: Secondary | ICD-10-CM | POA: Diagnosis not present

## 2022-02-07 DIAGNOSIS — Z171 Estrogen receptor negative status [ER-]: Secondary | ICD-10-CM | POA: Diagnosis not present

## 2022-02-07 DIAGNOSIS — G629 Polyneuropathy, unspecified: Secondary | ICD-10-CM | POA: Diagnosis not present

## 2022-02-07 DIAGNOSIS — K5900 Constipation, unspecified: Secondary | ICD-10-CM | POA: Diagnosis not present

## 2022-02-07 DIAGNOSIS — Z7952 Long term (current) use of systemic steroids: Secondary | ICD-10-CM | POA: Diagnosis not present

## 2022-02-07 DIAGNOSIS — Z794 Long term (current) use of insulin: Secondary | ICD-10-CM | POA: Diagnosis not present

## 2022-02-07 DIAGNOSIS — Z5111 Encounter for antineoplastic chemotherapy: Secondary | ICD-10-CM | POA: Diagnosis not present

## 2022-02-07 DIAGNOSIS — Z79899 Other long term (current) drug therapy: Secondary | ICD-10-CM | POA: Diagnosis not present

## 2022-02-07 DIAGNOSIS — K118 Other diseases of salivary glands: Secondary | ICD-10-CM | POA: Diagnosis not present

## 2022-02-09 DIAGNOSIS — Z171 Estrogen receptor negative status [ER-]: Secondary | ICD-10-CM | POA: Diagnosis not present

## 2022-02-09 DIAGNOSIS — C50411 Malignant neoplasm of upper-outer quadrant of right female breast: Secondary | ICD-10-CM | POA: Diagnosis not present

## 2022-02-09 DIAGNOSIS — Z7689 Persons encountering health services in other specified circumstances: Secondary | ICD-10-CM | POA: Diagnosis not present

## 2022-02-16 DIAGNOSIS — I959 Hypotension, unspecified: Secondary | ICD-10-CM | POA: Diagnosis not present

## 2022-02-16 DIAGNOSIS — Z794 Long term (current) use of insulin: Secondary | ICD-10-CM | POA: Diagnosis not present

## 2022-02-16 DIAGNOSIS — R0602 Shortness of breath: Secondary | ICD-10-CM | POA: Diagnosis not present

## 2022-02-16 DIAGNOSIS — Z20822 Contact with and (suspected) exposure to covid-19: Secondary | ICD-10-CM | POA: Diagnosis not present

## 2022-02-16 DIAGNOSIS — Z87891 Personal history of nicotine dependence: Secondary | ICD-10-CM | POA: Diagnosis not present

## 2022-02-16 DIAGNOSIS — J810 Acute pulmonary edema: Secondary | ICD-10-CM | POA: Diagnosis not present

## 2022-02-16 DIAGNOSIS — Z832 Family history of diseases of the blood and blood-forming organs and certain disorders involving the immune mechanism: Secondary | ICD-10-CM | POA: Diagnosis not present

## 2022-02-16 DIAGNOSIS — E860 Dehydration: Secondary | ICD-10-CM | POA: Diagnosis not present

## 2022-02-16 DIAGNOSIS — Z8042 Family history of malignant neoplasm of prostate: Secondary | ICD-10-CM | POA: Diagnosis not present

## 2022-02-16 DIAGNOSIS — J189 Pneumonia, unspecified organism: Secondary | ICD-10-CM | POA: Diagnosis not present

## 2022-02-16 DIAGNOSIS — G43909 Migraine, unspecified, not intractable, without status migrainosus: Secondary | ICD-10-CM | POA: Diagnosis not present

## 2022-02-16 DIAGNOSIS — R000 Tachycardia, unspecified: Secondary | ICD-10-CM | POA: Diagnosis not present

## 2022-02-16 DIAGNOSIS — Z90710 Acquired absence of both cervix and uterus: Secondary | ICD-10-CM | POA: Diagnosis not present

## 2022-02-16 DIAGNOSIS — R42 Dizziness and giddiness: Secondary | ICD-10-CM | POA: Diagnosis not present

## 2022-02-16 DIAGNOSIS — C50911 Malignant neoplasm of unspecified site of right female breast: Secondary | ICD-10-CM | POA: Diagnosis not present

## 2022-02-16 DIAGNOSIS — Z79890 Hormone replacement therapy (postmenopausal): Secondary | ICD-10-CM | POA: Diagnosis not present

## 2022-02-16 DIAGNOSIS — E119 Type 2 diabetes mellitus without complications: Secondary | ICD-10-CM | POA: Diagnosis not present

## 2022-02-16 DIAGNOSIS — I10 Essential (primary) hypertension: Secondary | ICD-10-CM | POA: Diagnosis not present

## 2022-02-16 DIAGNOSIS — R531 Weakness: Secondary | ICD-10-CM | POA: Diagnosis not present

## 2022-02-16 DIAGNOSIS — Z79899 Other long term (current) drug therapy: Secondary | ICD-10-CM | POA: Diagnosis not present

## 2022-02-16 DIAGNOSIS — C50919 Malignant neoplasm of unspecified site of unspecified female breast: Secondary | ICD-10-CM | POA: Diagnosis not present

## 2022-02-16 DIAGNOSIS — E7800 Pure hypercholesterolemia, unspecified: Secondary | ICD-10-CM | POA: Diagnosis not present

## 2022-02-16 DIAGNOSIS — E1122 Type 2 diabetes mellitus with diabetic chronic kidney disease: Secondary | ICD-10-CM | POA: Diagnosis not present

## 2022-02-16 DIAGNOSIS — E039 Hypothyroidism, unspecified: Secondary | ICD-10-CM | POA: Diagnosis not present

## 2022-02-16 DIAGNOSIS — E785 Hyperlipidemia, unspecified: Secondary | ICD-10-CM | POA: Diagnosis not present

## 2022-02-19 DIAGNOSIS — J810 Acute pulmonary edema: Secondary | ICD-10-CM | POA: Diagnosis not present

## 2022-02-21 DIAGNOSIS — K5900 Constipation, unspecified: Secondary | ICD-10-CM | POA: Diagnosis not present

## 2022-02-21 DIAGNOSIS — Z08 Encounter for follow-up examination after completed treatment for malignant neoplasm: Secondary | ICD-10-CM | POA: Diagnosis not present

## 2022-02-21 DIAGNOSIS — Z853 Personal history of malignant neoplasm of breast: Secondary | ICD-10-CM | POA: Diagnosis not present

## 2022-02-21 DIAGNOSIS — Z885 Allergy status to narcotic agent status: Secondary | ICD-10-CM | POA: Diagnosis not present

## 2022-02-21 DIAGNOSIS — E039 Hypothyroidism, unspecified: Secondary | ICD-10-CM | POA: Diagnosis not present

## 2022-02-21 DIAGNOSIS — D281 Benign neoplasm of vagina: Secondary | ICD-10-CM | POA: Diagnosis not present

## 2022-02-21 DIAGNOSIS — E876 Hypokalemia: Secondary | ICD-10-CM | POA: Diagnosis not present

## 2022-02-21 DIAGNOSIS — L0292 Furuncle, unspecified: Secondary | ICD-10-CM | POA: Diagnosis not present

## 2022-02-21 DIAGNOSIS — D229 Melanocytic nevi, unspecified: Secondary | ICD-10-CM | POA: Diagnosis not present

## 2022-02-21 DIAGNOSIS — Z171 Estrogen receptor negative status [ER-]: Secondary | ICD-10-CM | POA: Diagnosis not present

## 2022-02-21 DIAGNOSIS — C50411 Malignant neoplasm of upper-outer quadrant of right female breast: Secondary | ICD-10-CM | POA: Diagnosis not present

## 2022-02-21 DIAGNOSIS — E064 Drug-induced thyroiditis: Secondary | ICD-10-CM | POA: Diagnosis not present

## 2022-02-21 DIAGNOSIS — R220 Localized swelling, mass and lump, head: Secondary | ICD-10-CM | POA: Diagnosis not present

## 2022-02-21 DIAGNOSIS — K759 Inflammatory liver disease, unspecified: Secondary | ICD-10-CM | POA: Diagnosis not present

## 2022-02-21 DIAGNOSIS — G629 Polyneuropathy, unspecified: Secondary | ICD-10-CM | POA: Diagnosis not present

## 2022-02-21 DIAGNOSIS — D649 Anemia, unspecified: Secondary | ICD-10-CM | POA: Diagnosis not present

## 2022-02-21 DIAGNOSIS — Z79890 Hormone replacement therapy (postmenopausal): Secondary | ICD-10-CM | POA: Diagnosis not present

## 2022-02-27 DIAGNOSIS — Z171 Estrogen receptor negative status [ER-]: Secondary | ICD-10-CM | POA: Diagnosis not present

## 2022-02-27 DIAGNOSIS — J810 Acute pulmonary edema: Secondary | ICD-10-CM | POA: Diagnosis not present

## 2022-02-27 DIAGNOSIS — C50411 Malignant neoplasm of upper-outer quadrant of right female breast: Secondary | ICD-10-CM | POA: Diagnosis not present

## 2022-02-27 DIAGNOSIS — Z23 Encounter for immunization: Secondary | ICD-10-CM | POA: Diagnosis not present

## 2022-03-05 DIAGNOSIS — Z171 Estrogen receptor negative status [ER-]: Secondary | ICD-10-CM | POA: Diagnosis not present

## 2022-03-05 DIAGNOSIS — C50411 Malignant neoplasm of upper-outer quadrant of right female breast: Secondary | ICD-10-CM | POA: Diagnosis not present

## 2022-03-05 DIAGNOSIS — Z9221 Personal history of antineoplastic chemotherapy: Secondary | ICD-10-CM | POA: Diagnosis not present

## 2022-03-05 DIAGNOSIS — D229 Melanocytic nevi, unspecified: Secondary | ICD-10-CM | POA: Diagnosis not present

## 2022-03-07 DIAGNOSIS — C50411 Malignant neoplasm of upper-outer quadrant of right female breast: Secondary | ICD-10-CM | POA: Diagnosis not present

## 2022-03-07 DIAGNOSIS — Z171 Estrogen receptor negative status [ER-]: Secondary | ICD-10-CM | POA: Diagnosis not present

## 2022-03-18 DIAGNOSIS — D37030 Neoplasm of uncertain behavior of the parotid salivary glands: Secondary | ICD-10-CM | POA: Diagnosis not present

## 2022-03-18 DIAGNOSIS — D110 Benign neoplasm of parotid gland: Secondary | ICD-10-CM | POA: Diagnosis not present

## 2022-03-18 DIAGNOSIS — K118 Other diseases of salivary glands: Secondary | ICD-10-CM | POA: Diagnosis not present

## 2022-03-18 DIAGNOSIS — C50411 Malignant neoplasm of upper-outer quadrant of right female breast: Secondary | ICD-10-CM | POA: Diagnosis not present

## 2022-03-18 DIAGNOSIS — C50919 Malignant neoplasm of unspecified site of unspecified female breast: Secondary | ICD-10-CM | POA: Diagnosis not present

## 2022-03-18 DIAGNOSIS — Z171 Estrogen receptor negative status [ER-]: Secondary | ICD-10-CM | POA: Diagnosis not present

## 2022-03-19 DIAGNOSIS — C50411 Malignant neoplasm of upper-outer quadrant of right female breast: Secondary | ICD-10-CM | POA: Diagnosis not present

## 2022-03-19 DIAGNOSIS — C50919 Malignant neoplasm of unspecified site of unspecified female breast: Secondary | ICD-10-CM | POA: Diagnosis not present

## 2022-03-19 DIAGNOSIS — Z171 Estrogen receptor negative status [ER-]: Secondary | ICD-10-CM | POA: Diagnosis not present

## 2022-03-19 DIAGNOSIS — D229 Melanocytic nevi, unspecified: Secondary | ICD-10-CM | POA: Diagnosis not present

## 2022-03-19 DIAGNOSIS — Z9221 Personal history of antineoplastic chemotherapy: Secondary | ICD-10-CM | POA: Diagnosis not present

## 2022-03-20 DIAGNOSIS — Z9221 Personal history of antineoplastic chemotherapy: Secondary | ICD-10-CM | POA: Diagnosis not present

## 2022-03-20 DIAGNOSIS — N6021 Fibroadenosis of right breast: Secondary | ICD-10-CM | POA: Diagnosis not present

## 2022-03-20 DIAGNOSIS — R920 Mammographic microcalcification found on diagnostic imaging of breast: Secondary | ICD-10-CM | POA: Diagnosis not present

## 2022-03-20 DIAGNOSIS — C50411 Malignant neoplasm of upper-outer quadrant of right female breast: Secondary | ICD-10-CM | POA: Diagnosis not present

## 2022-03-20 DIAGNOSIS — D241 Benign neoplasm of right breast: Secondary | ICD-10-CM | POA: Diagnosis not present

## 2022-03-20 DIAGNOSIS — D2372 Other benign neoplasm of skin of left lower limb, including hip: Secondary | ICD-10-CM | POA: Diagnosis not present

## 2022-03-20 DIAGNOSIS — R2242 Localized swelling, mass and lump, left lower limb: Secondary | ICD-10-CM | POA: Diagnosis not present

## 2022-03-20 DIAGNOSIS — N6011 Diffuse cystic mastopathy of right breast: Secondary | ICD-10-CM | POA: Diagnosis not present

## 2022-03-20 DIAGNOSIS — N6022 Fibroadenosis of left breast: Secondary | ICD-10-CM | POA: Diagnosis not present

## 2022-03-20 DIAGNOSIS — G8918 Other acute postprocedural pain: Secondary | ICD-10-CM | POA: Diagnosis not present

## 2022-03-20 DIAGNOSIS — D229 Melanocytic nevi, unspecified: Secondary | ICD-10-CM | POA: Diagnosis not present

## 2022-03-20 DIAGNOSIS — N6012 Diffuse cystic mastopathy of left breast: Secondary | ICD-10-CM | POA: Diagnosis not present

## 2022-03-21 DIAGNOSIS — C50411 Malignant neoplasm of upper-outer quadrant of right female breast: Secondary | ICD-10-CM | POA: Diagnosis not present

## 2022-03-21 DIAGNOSIS — Z9012 Acquired absence of left breast and nipple: Secondary | ICD-10-CM | POA: Diagnosis not present

## 2022-04-07 DIAGNOSIS — R5383 Other fatigue: Secondary | ICD-10-CM | POA: Diagnosis not present

## 2022-04-07 DIAGNOSIS — Y92538 Other ambulatory health services establishments as the place of occurrence of the external cause: Secondary | ICD-10-CM | POA: Diagnosis not present

## 2022-04-07 DIAGNOSIS — L0292 Furuncle, unspecified: Secondary | ICD-10-CM | POA: Diagnosis not present

## 2022-04-07 DIAGNOSIS — E876 Hypokalemia: Secondary | ICD-10-CM | POA: Diagnosis not present

## 2022-04-07 DIAGNOSIS — Z9221 Personal history of antineoplastic chemotherapy: Secondary | ICD-10-CM | POA: Diagnosis not present

## 2022-04-07 DIAGNOSIS — D165 Benign neoplasm of lower jaw bone: Secondary | ICD-10-CM | POA: Diagnosis not present

## 2022-04-07 DIAGNOSIS — Z9013 Acquired absence of bilateral breasts and nipples: Secondary | ICD-10-CM | POA: Diagnosis not present

## 2022-04-07 DIAGNOSIS — E039 Hypothyroidism, unspecified: Secondary | ICD-10-CM | POA: Diagnosis not present

## 2022-04-07 DIAGNOSIS — T451X5A Adverse effect of antineoplastic and immunosuppressive drugs, initial encounter: Secondary | ICD-10-CM | POA: Diagnosis not present

## 2022-04-07 DIAGNOSIS — D7389 Other diseases of spleen: Secondary | ICD-10-CM | POA: Diagnosis not present

## 2022-04-07 DIAGNOSIS — D281 Benign neoplasm of vagina: Secondary | ICD-10-CM | POA: Diagnosis not present

## 2022-04-07 DIAGNOSIS — D649 Anemia, unspecified: Secondary | ICD-10-CM | POA: Diagnosis not present

## 2022-04-07 DIAGNOSIS — D2372 Other benign neoplasm of skin of left lower limb, including hip: Secondary | ICD-10-CM | POA: Diagnosis not present

## 2022-04-07 DIAGNOSIS — K5900 Constipation, unspecified: Secondary | ICD-10-CM | POA: Diagnosis not present

## 2022-04-07 DIAGNOSIS — G629 Polyneuropathy, unspecified: Secondary | ICD-10-CM | POA: Diagnosis not present

## 2022-04-07 DIAGNOSIS — Z171 Estrogen receptor negative status [ER-]: Secondary | ICD-10-CM | POA: Diagnosis not present

## 2022-04-07 DIAGNOSIS — C50411 Malignant neoplasm of upper-outer quadrant of right female breast: Secondary | ICD-10-CM | POA: Diagnosis not present

## 2022-04-07 DIAGNOSIS — E119 Type 2 diabetes mellitus without complications: Secondary | ICD-10-CM | POA: Diagnosis not present

## 2022-04-25 DIAGNOSIS — Z452 Encounter for adjustment and management of vascular access device: Secondary | ICD-10-CM | POA: Diagnosis not present

## 2023-01-11 IMAGING — CR DG KNEE COMPLETE 4+V*L*
4 series · 4 of 4 positions shown · non-contrast
Comparison: None.

CLINICAL DATA: Knee injury, pain.

EXAM:
LEFT KNEE - COMPLETE 4+ VIEW

[x knee ap left (1 of 3)]
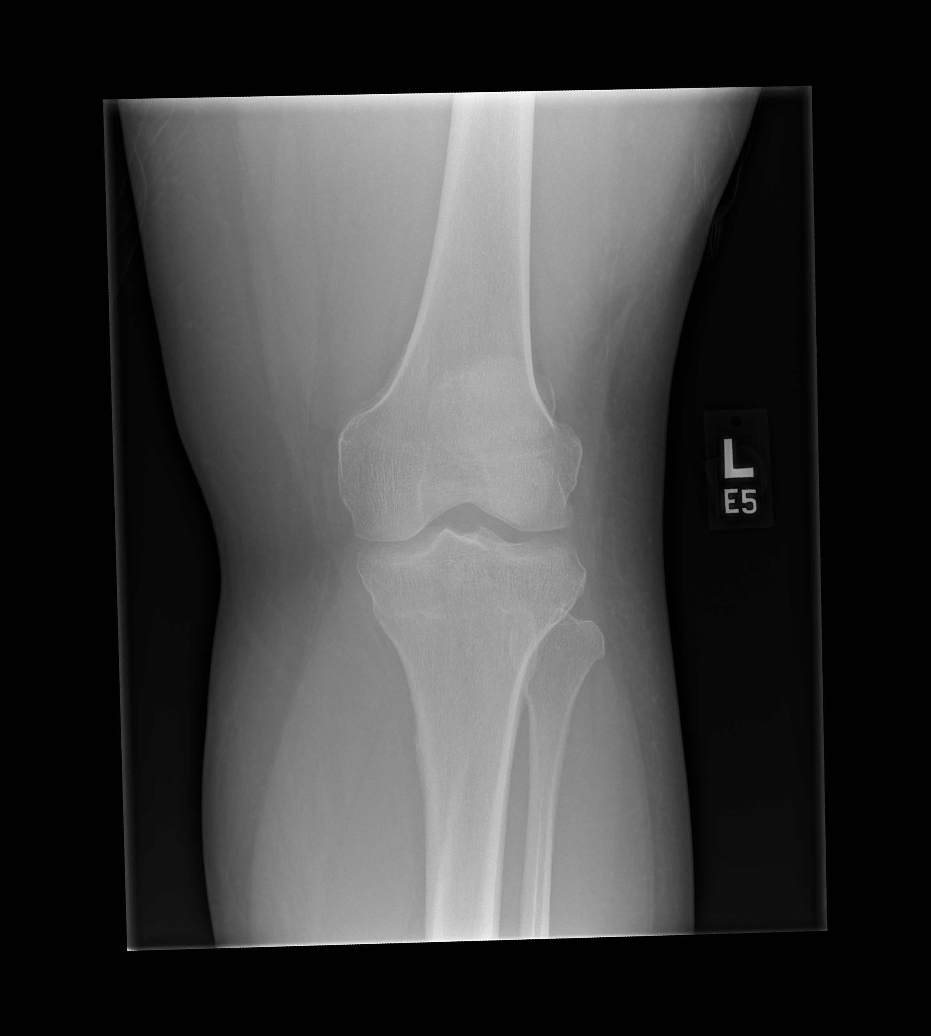

[x knee ap left (2 of 3)]
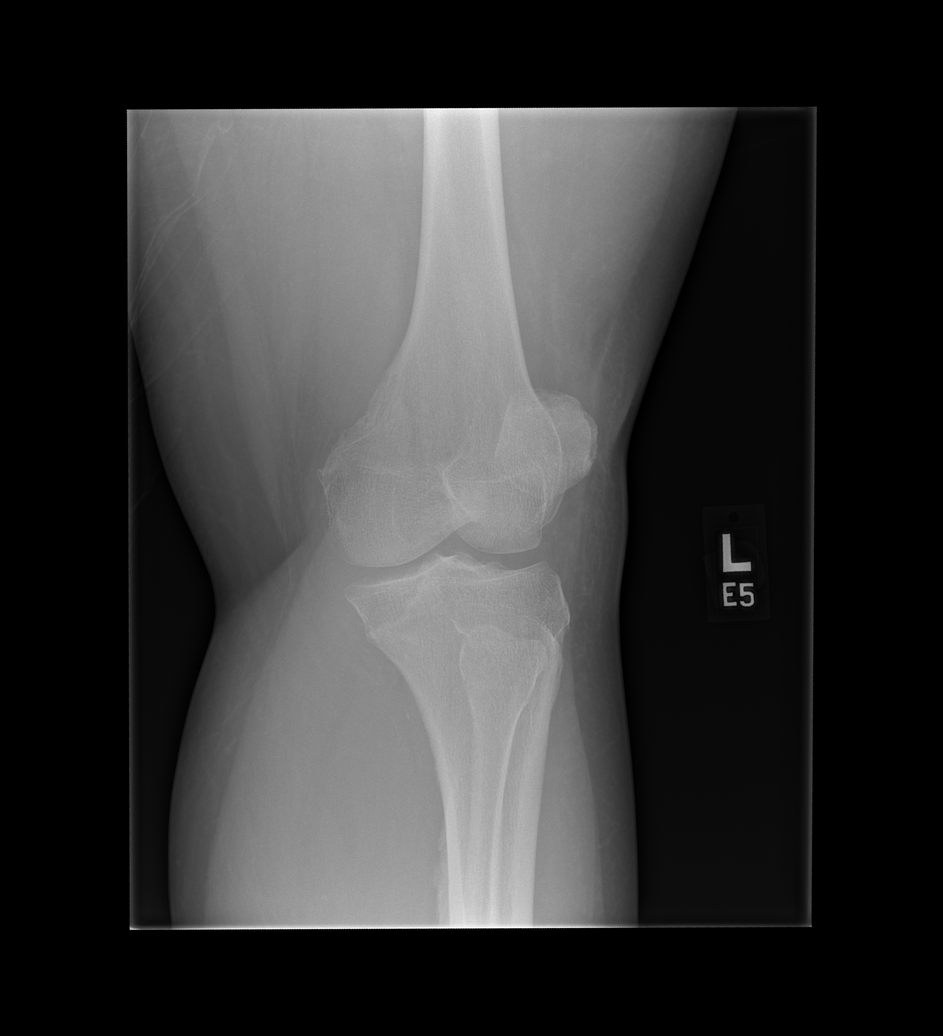

[x knee ap left (3 of 3)]
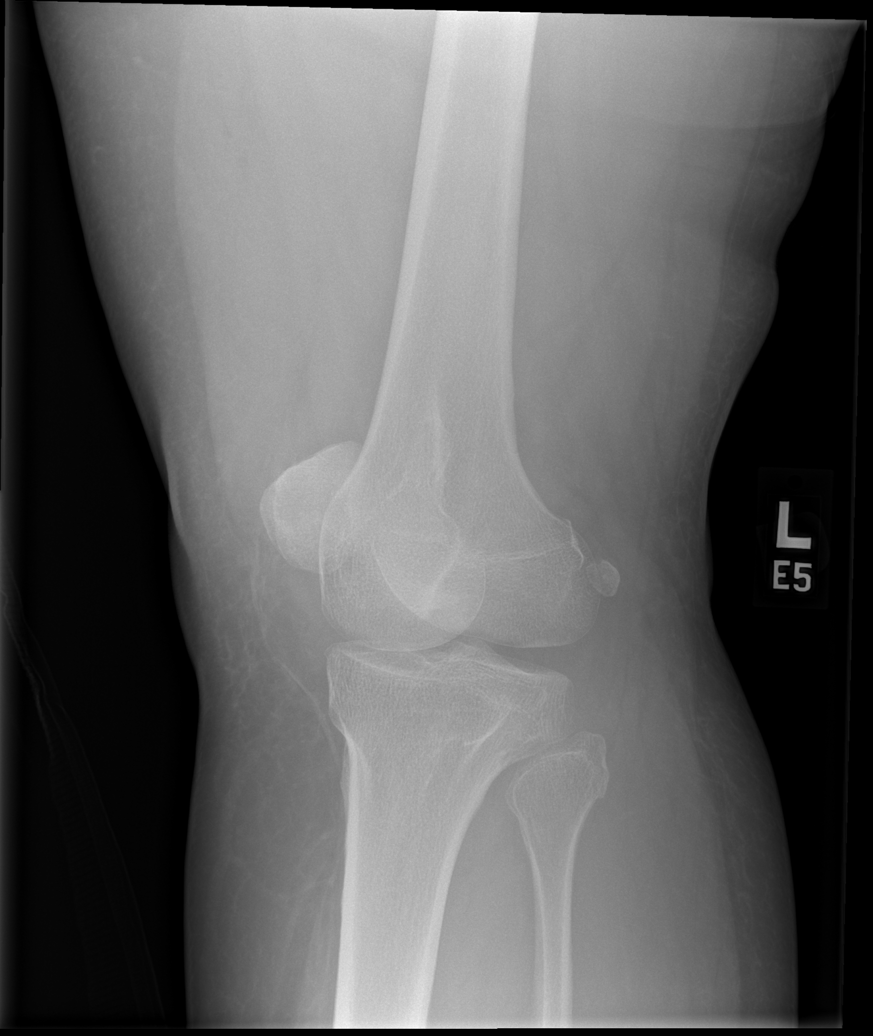

[x knee lat left]
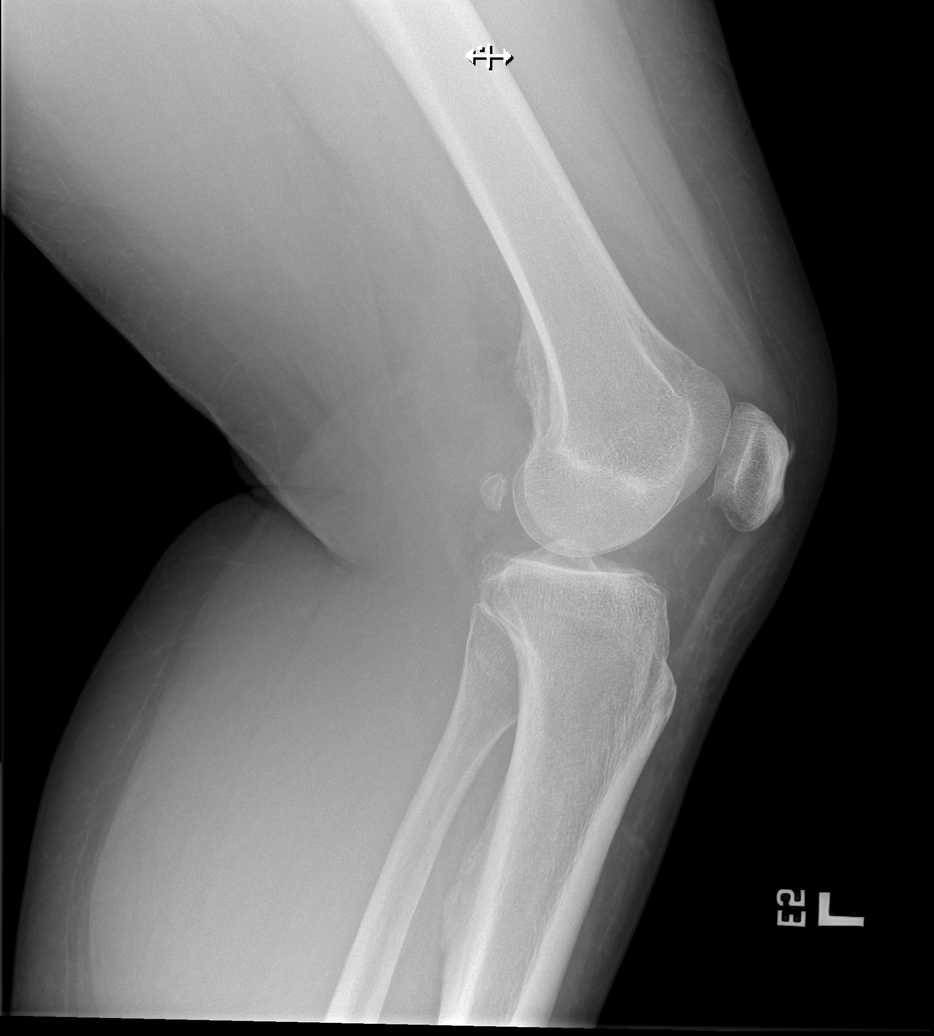

[4 of 4 positions shown; findings below may reference images not displayed]

FINDINGS: Osseous alignment is normal. No fracture line or displaced fracture
fragment is seen. No acute-appearing cortical irregularity or
osseous lesion. No degenerative change. No appreciable joint
effusion and overlying soft tissues are unremarkable.
IMPRESSION: Negative.

## 2023-01-13 IMAGING — DX DG CHEST 1V PORT
1 series · 1 of 1 positions shown · non-contrast
Comparison: June 28, 2009.

CLINICAL DATA: Shortness of breath.

EXAM:
PORTABLE CHEST 1 VIEW

[chest ap]
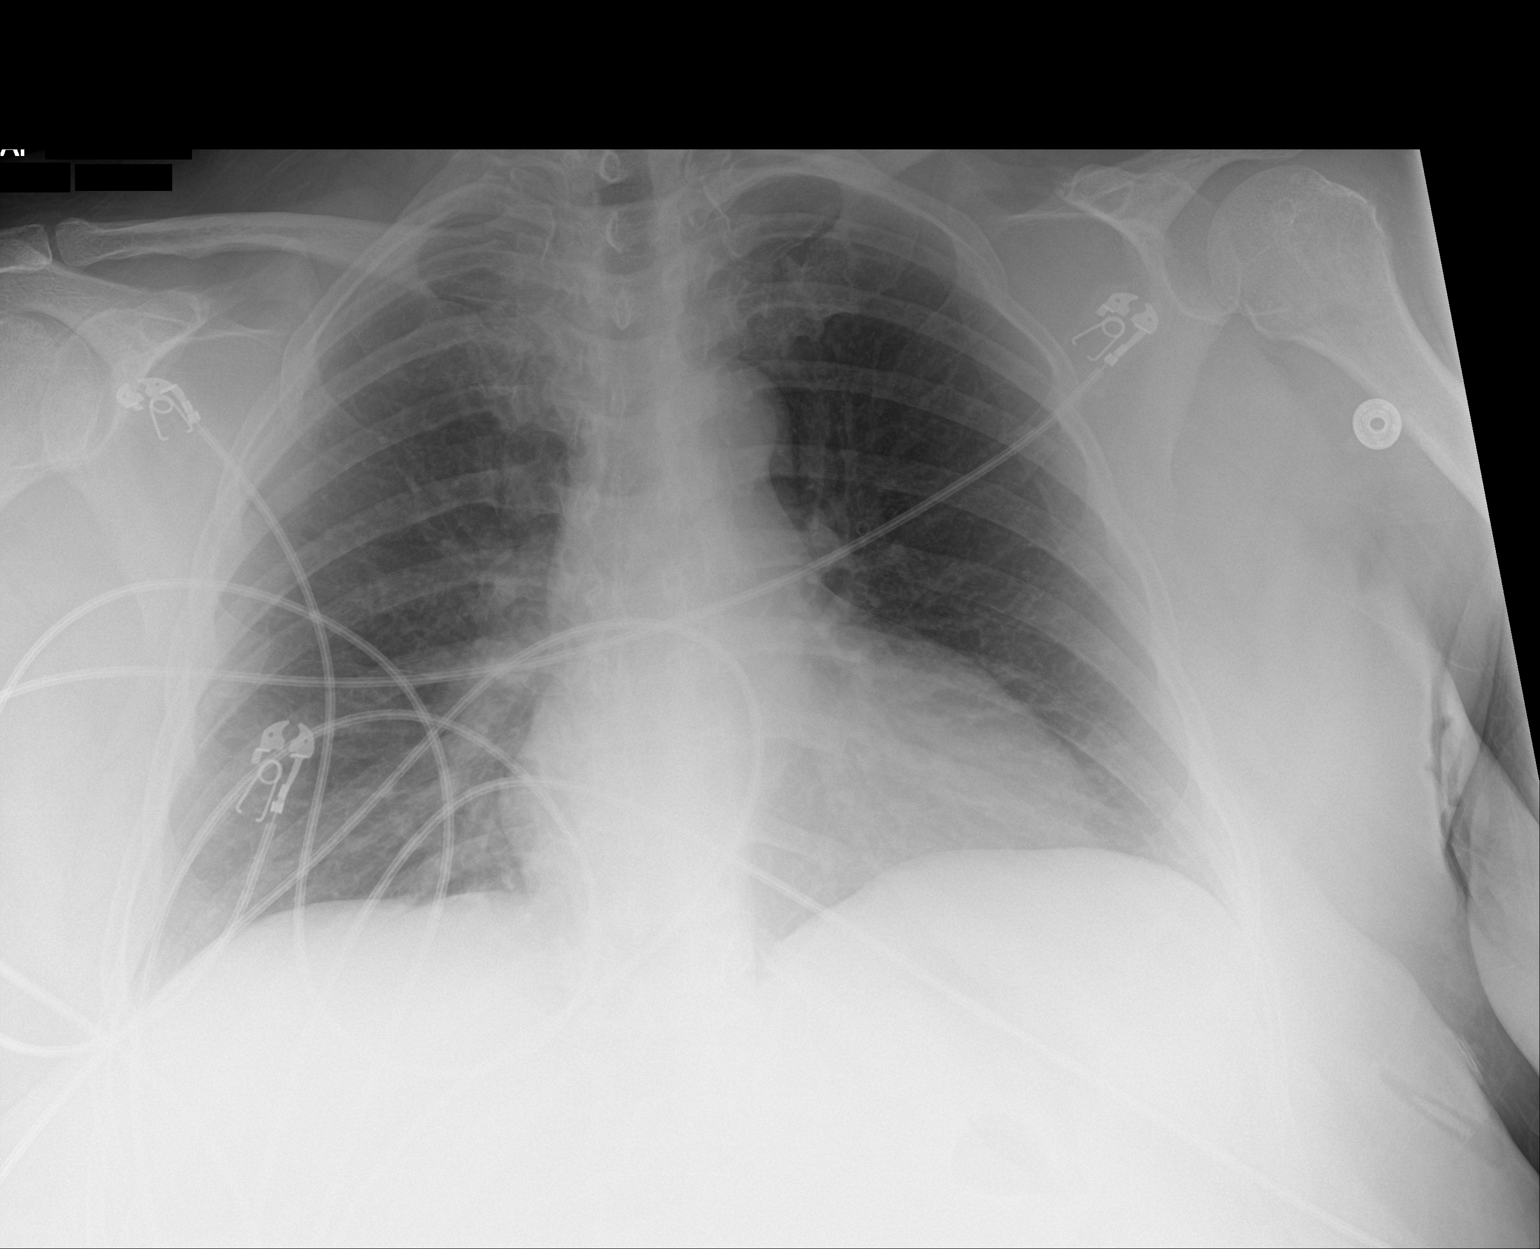

[1 of 1 positions shown; findings below may reference images not displayed]

FINDINGS: The heart size and mediastinal contours are within normal limits.
Both lungs are clear. The visualized skeletal structures are
unremarkable.
IMPRESSION: No active disease.

## 2023-07-21 ENCOUNTER — Other Ambulatory Visit (HOSPITAL_BASED_OUTPATIENT_CLINIC_OR_DEPARTMENT_OTHER): Payer: Self-pay

## 2023-07-21 MED ORDER — MOUNJARO 7.5 MG/0.5ML ~~LOC~~ SOAJ
7.5000 mg | SUBCUTANEOUS | 0 refills | Status: DC
  Filled 2023-07-21: qty 2, 28d supply, fill #0

## 2023-08-21 ENCOUNTER — Other Ambulatory Visit (HOSPITAL_COMMUNITY): Payer: Self-pay

## 2023-08-21 ENCOUNTER — Other Ambulatory Visit (HOSPITAL_BASED_OUTPATIENT_CLINIC_OR_DEPARTMENT_OTHER): Payer: Self-pay

## 2023-08-21 MED ORDER — MOUNJARO 7.5 MG/0.5ML ~~LOC~~ SOAJ
7.5000 mg | SUBCUTANEOUS | 0 refills | Status: DC
  Filled 2023-08-21 – 2023-08-25 (×2): qty 2, 28d supply, fill #0

## 2023-08-24 ENCOUNTER — Other Ambulatory Visit (HOSPITAL_BASED_OUTPATIENT_CLINIC_OR_DEPARTMENT_OTHER): Payer: Self-pay

## 2023-08-24 MED ORDER — MOUNJARO 7.5 MG/0.5ML ~~LOC~~ SOAJ
7.5000 mg | SUBCUTANEOUS | 0 refills | Status: DC
  Filled 2023-08-24 – 2023-09-19 (×4): qty 2, 28d supply, fill #0

## 2023-08-25 ENCOUNTER — Other Ambulatory Visit (HOSPITAL_BASED_OUTPATIENT_CLINIC_OR_DEPARTMENT_OTHER): Payer: Self-pay

## 2023-08-26 ENCOUNTER — Other Ambulatory Visit (HOSPITAL_BASED_OUTPATIENT_CLINIC_OR_DEPARTMENT_OTHER): Payer: Self-pay

## 2023-08-26 MED ORDER — LEVOTHYROXINE SODIUM 175 MCG PO TABS
175.0000 ug | ORAL_TABLET | Freq: Every day | ORAL | 2 refills | Status: AC
Start: 2023-06-23 — End: ?
  Filled 2023-09-17: qty 90, 90d supply, fill #0
  Filled 2024-01-29: qty 90, 90d supply, fill #1

## 2023-08-26 MED ORDER — LABETALOL HCL 200 MG PO TABS
200.0000 mg | ORAL_TABLET | Freq: Two times a day (BID) | ORAL | 3 refills | Status: AC
  Filled 2023-09-17: qty 180, 90d supply, fill #0

## 2023-08-26 MED ORDER — VARENICLINE TARTRATE (STARTER) 0.5 MG X 11 & 1 MG X 42 PO TBPK
ORAL_TABLET | ORAL | 0 refills | Status: DC
Start: 2023-05-11 — End: 2023-09-23

## 2023-08-26 MED ORDER — PREGABALIN 75 MG PO CAPS
75.0000 mg | ORAL_CAPSULE | Freq: Two times a day (BID) | ORAL | 1 refills | Status: DC
  Filled 2023-08-26: qty 180, 90d supply, fill #0

## 2023-08-26 MED ORDER — METFORMIN HCL ER 500 MG PO TB24
500.0000 mg | ORAL_TABLET | Freq: Every day | ORAL | 1 refills | Status: DC
  Filled 2023-09-17: qty 90, 90d supply, fill #0

## 2023-09-17 ENCOUNTER — Other Ambulatory Visit: Payer: Self-pay

## 2023-09-17 ENCOUNTER — Other Ambulatory Visit (HOSPITAL_BASED_OUTPATIENT_CLINIC_OR_DEPARTMENT_OTHER): Payer: Self-pay

## 2023-09-17 MED ORDER — PREGABALIN 75 MG PO CAPS
75.0000 mg | ORAL_CAPSULE | Freq: Two times a day (BID) | ORAL | 1 refills | Status: AC
  Filled 2023-09-17: qty 180, 90d supply, fill #0

## 2023-09-18 ENCOUNTER — Other Ambulatory Visit (HOSPITAL_BASED_OUTPATIENT_CLINIC_OR_DEPARTMENT_OTHER): Payer: Self-pay

## 2023-09-19 ENCOUNTER — Other Ambulatory Visit: Payer: Self-pay

## 2023-09-19 ENCOUNTER — Other Ambulatory Visit (HOSPITAL_BASED_OUTPATIENT_CLINIC_OR_DEPARTMENT_OTHER): Payer: Self-pay

## 2023-09-22 ENCOUNTER — Inpatient Hospital Stay (HOSPITAL_COMMUNITY)
Admission: EM | Admit: 2023-09-22 | Discharge: 2023-09-29 | DRG: 184 | Disposition: A | Discharge status: Home / Self Care | Payer: MEDICAID | Attending: Surgery | Admitting: Surgery

## 2023-09-22 ENCOUNTER — Emergency Department (HOSPITAL_COMMUNITY): Payer: MEDICAID

## 2023-09-22 ENCOUNTER — Other Ambulatory Visit: Payer: Self-pay

## 2023-09-22 DIAGNOSIS — Z825 Family history of asthma and other chronic lower respiratory diseases: Secondary | ICD-10-CM

## 2023-09-22 DIAGNOSIS — S52572A Other intraarticular fracture of lower end of left radius, initial encounter for closed fracture: Secondary | ICD-10-CM | POA: Diagnosis present

## 2023-09-22 DIAGNOSIS — Z8269 Family history of other diseases of the musculoskeletal system and connective tissue: Secondary | ICD-10-CM

## 2023-09-22 DIAGNOSIS — Z83719 Family history of colon polyps, unspecified: Secondary | ICD-10-CM

## 2023-09-22 DIAGNOSIS — S22088A Other fracture of T11-T12 vertebra, initial encounter for closed fracture: Secondary | ICD-10-CM

## 2023-09-22 DIAGNOSIS — Z6841 Body Mass Index (BMI) 40.0 and over, adult: Secondary | ICD-10-CM | POA: Diagnosis not present

## 2023-09-22 DIAGNOSIS — N179 Acute kidney failure, unspecified: Secondary | ICD-10-CM | POA: Diagnosis present

## 2023-09-22 DIAGNOSIS — K219 Gastro-esophageal reflux disease without esophagitis: Secondary | ICD-10-CM | POA: Diagnosis present

## 2023-09-22 DIAGNOSIS — Z7984 Long term (current) use of oral hypoglycemic drugs: Secondary | ICD-10-CM

## 2023-09-22 DIAGNOSIS — S32028A Other fracture of second lumbar vertebra, initial encounter for closed fracture: Secondary | ICD-10-CM | POA: Diagnosis present

## 2023-09-22 DIAGNOSIS — Z79890 Hormone replacement therapy (postmenopausal): Secondary | ICD-10-CM

## 2023-09-22 DIAGNOSIS — D62 Acute posthemorrhagic anemia: Secondary | ICD-10-CM | POA: Diagnosis not present

## 2023-09-22 DIAGNOSIS — Z791 Long term (current) use of non-steroidal anti-inflammatories (NSAID): Secondary | ICD-10-CM

## 2023-09-22 DIAGNOSIS — S2243XA Multiple fractures of ribs, bilateral, initial encounter for closed fracture: Secondary | ICD-10-CM | POA: Diagnosis present

## 2023-09-22 DIAGNOSIS — Z8249 Family history of ischemic heart disease and other diseases of the circulatory system: Secondary | ICD-10-CM

## 2023-09-22 DIAGNOSIS — R7401 Elevation of levels of liver transaminase levels: Secondary | ICD-10-CM | POA: Diagnosis present

## 2023-09-22 DIAGNOSIS — Z7985 Long-term (current) use of injectable non-insulin antidiabetic drugs: Secondary | ICD-10-CM | POA: Diagnosis not present

## 2023-09-22 DIAGNOSIS — R339 Retention of urine, unspecified: Secondary | ICD-10-CM | POA: Diagnosis present

## 2023-09-22 DIAGNOSIS — Y92414 Local residential or business street as the place of occurrence of the external cause: Secondary | ICD-10-CM | POA: Diagnosis not present

## 2023-09-22 DIAGNOSIS — E781 Pure hyperglyceridemia: Secondary | ICD-10-CM | POA: Diagnosis present

## 2023-09-22 DIAGNOSIS — Z87892 Personal history of anaphylaxis: Secondary | ICD-10-CM

## 2023-09-22 DIAGNOSIS — Z888 Allergy status to other drugs, medicaments and biological substances status: Secondary | ICD-10-CM

## 2023-09-22 DIAGNOSIS — Z885 Allergy status to narcotic agent status: Secondary | ICD-10-CM

## 2023-09-22 DIAGNOSIS — E669 Obesity, unspecified: Secondary | ICD-10-CM | POA: Diagnosis present

## 2023-09-22 DIAGNOSIS — G629 Polyneuropathy, unspecified: Secondary | ICD-10-CM | POA: Diagnosis not present

## 2023-09-22 DIAGNOSIS — Z87891 Personal history of nicotine dependence: Secondary | ICD-10-CM | POA: Diagnosis not present

## 2023-09-22 DIAGNOSIS — S22089A Unspecified fracture of T11-T12 vertebra, initial encounter for closed fracture: Secondary | ICD-10-CM | POA: Diagnosis present

## 2023-09-22 DIAGNOSIS — S32038A Other fracture of third lumbar vertebra, initial encounter for closed fracture: Secondary | ICD-10-CM | POA: Diagnosis present

## 2023-09-22 DIAGNOSIS — Z87440 Personal history of urinary (tract) infections: Secondary | ICD-10-CM

## 2023-09-22 DIAGNOSIS — R1031 Right lower quadrant pain: Secondary | ICD-10-CM | POA: Diagnosis not present

## 2023-09-22 DIAGNOSIS — R059 Cough, unspecified: Secondary | ICD-10-CM | POA: Diagnosis not present

## 2023-09-22 DIAGNOSIS — Z79899 Other long term (current) drug therapy: Secondary | ICD-10-CM

## 2023-09-22 DIAGNOSIS — Z90710 Acquired absence of both cervix and uterus: Secondary | ICD-10-CM

## 2023-09-22 DIAGNOSIS — I10 Essential (primary) hypertension: Secondary | ICD-10-CM | POA: Diagnosis present

## 2023-09-22 DIAGNOSIS — Z800 Family history of malignant neoplasm of digestive organs: Secondary | ICD-10-CM

## 2023-09-22 LAB — I-STAT CHEM 8, ED
BUN: 15 mg/dL (ref 6–20)
Calcium, Ion: 1.12 mmol/L — ABNORMAL LOW (ref 1.15–1.40)
Chloride: 106 mmol/L (ref 98–111)
Creatinine, Ser: 1.6 mg/dL — ABNORMAL HIGH (ref 0.44–1.00)
Glucose, Bld: 169 mg/dL — ABNORMAL HIGH (ref 70–99)
HCT: 36 % (ref 36.0–46.0)
Hemoglobin: 12.2 g/dL (ref 12.0–15.0)
Potassium: 3.8 mmol/L (ref 3.5–5.1)
Sodium: 143 mmol/L (ref 135–145)
TCO2: 21 mmol/L — ABNORMAL LOW (ref 22–32)

## 2023-09-22 LAB — CBC
HCT: 36.8 % (ref 36.0–46.0)
Hemoglobin: 11.9 g/dL — ABNORMAL LOW (ref 12.0–15.0)
MCH: 28.6 pg (ref 26.0–34.0)
MCHC: 32.3 g/dL (ref 30.0–36.0)
MCV: 88.5 fL (ref 80.0–100.0)
Platelets: 210 10*3/uL (ref 150–400)
RBC: 4.16 MIL/uL (ref 3.87–5.11)
RDW: 15.3 % (ref 11.5–15.5)
WBC: 12.2 10*3/uL — ABNORMAL HIGH (ref 4.0–10.5)
nRBC: 0 % (ref 0.0–0.2)

## 2023-09-22 LAB — COMPREHENSIVE METABOLIC PANEL WITH GFR
ALT: 120 U/L — ABNORMAL HIGH (ref 0–44)
AST: 150 U/L — ABNORMAL HIGH (ref 15–41)
Albumin: 3.9 g/dL (ref 3.5–5.0)
Alkaline Phosphatase: 51 U/L (ref 38–126)
Anion gap: 9 (ref 5–15)
BUN: 11 mg/dL (ref 6–20)
CO2: 23 mmol/L (ref 22–32)
Calcium: 8.7 mg/dL — ABNORMAL LOW (ref 8.9–10.3)
Chloride: 108 mmol/L (ref 98–111)
Creatinine, Ser: 1.58 mg/dL — ABNORMAL HIGH (ref 0.44–1.00)
GFR, Estimated: 38 mL/min — ABNORMAL LOW (ref >60)
Glucose, Bld: 177 mg/dL — ABNORMAL HIGH (ref 70–99)
Potassium: 3.8 mmol/L (ref 3.5–5.1)
Sodium: 140 mmol/L (ref 135–145)
Total Bilirubin: 0.8 mg/dL (ref 0.0–1.2)
Total Protein: 6.2 g/dL — ABNORMAL LOW (ref 6.5–8.1)

## 2023-09-22 LAB — SAMPLE TO BLOOD BANK

## 2023-09-22 LAB — PROTIME-INR
INR: 1 (ref 0.8–1.2)
Prothrombin Time: 12.9 s (ref 11.4–15.2)

## 2023-09-22 LAB — ETHANOL: Alcohol, Ethyl (B): <15 mg/dL (ref <15)

## 2023-09-22 LAB — I-STAT CG4 LACTIC ACID, ED: Lactic Acid, Venous: 3.3 mmol/L (ref 0.5–1.9)

## 2023-09-22 MED ORDER — AMLODIPINE BESYLATE 5 MG PO TABS
5.0000 mg | ORAL_TABLET | Freq: Every day | ORAL | Status: DC
  Administered 2023-09-24 – 2023-09-28 (×5): 5 mg via ORAL
  Filled 2023-09-22 (×6): qty 1

## 2023-09-22 MED ORDER — METFORMIN HCL ER 500 MG PO TB24
500.0000 mg | ORAL_TABLET | Freq: Every day | ORAL | Status: DC
  Administered 2023-09-25 – 2023-09-29 (×5): 500 mg via ORAL
  Filled 2023-09-22 (×6): qty 1

## 2023-09-22 MED ORDER — METHOCARBAMOL 1000 MG/10ML IJ SOLN: 500.0000 mg | Freq: Three times a day (TID) | INTRAMUSCULAR | Status: DC

## 2023-09-22 MED ORDER — ONDANSETRON 4 MG PO TBDP: 4.0000 mg | ORAL_TABLET | Freq: Four times a day (QID) | ORAL | Status: DC | PRN

## 2023-09-22 MED ORDER — LACTATED RINGERS IV BOLUS
1000.0000 mL | Freq: Once | INTRAVENOUS | Status: AC
  Administered 2023-09-22: 1000 mL via INTRAVENOUS

## 2023-09-22 MED ORDER — ONDANSETRON HCL 4 MG/2ML IJ SOLN: 4.0000 mg | Freq: Four times a day (QID) | INTRAMUSCULAR | Status: DC | PRN

## 2023-09-22 MED ORDER — MORPHINE SULFATE (PF) 4 MG/ML IV SOLN
4.0000 mg | Freq: Once | INTRAVENOUS | Status: AC
  Administered 2023-09-22: 4 mg via INTRAVENOUS
  Filled 2023-09-22: qty 1

## 2023-09-22 MED ORDER — ACETAMINOPHEN 500 MG PO TABS
1000.0000 mg | ORAL_TABLET | Freq: Four times a day (QID) | ORAL | Status: DC
  Administered 2023-09-23 – 2023-09-29 (×25): 1000 mg via ORAL
  Filled 2023-09-22 (×26): qty 2

## 2023-09-22 MED ORDER — LEVOTHYROXINE SODIUM 75 MCG PO TABS
175.0000 ug | ORAL_TABLET | Freq: Every day | ORAL | Status: DC
  Administered 2023-09-23 – 2023-09-29 (×7): 175 ug via ORAL
  Filled 2023-09-22 (×7): qty 1

## 2023-09-22 MED ORDER — HYDRALAZINE HCL 20 MG/ML IJ SOLN: 10.0000 mg | INTRAMUSCULAR | Status: DC | PRN

## 2023-09-22 MED ORDER — HYDROMORPHONE HCL 1 MG/ML IJ SOLN
0.5000 mg | Freq: Once | INTRAMUSCULAR | Status: AC
  Administered 2023-09-22: 0.5 mg via INTRAVENOUS
  Filled 2023-09-22: qty 1

## 2023-09-22 MED ORDER — METOPROLOL TARTRATE 5 MG/5ML IV SOLN: 5.0000 mg | Freq: Four times a day (QID) | INTRAVENOUS | Status: DC | PRN

## 2023-09-22 MED ORDER — LABETALOL HCL 200 MG PO TABS
200.0000 mg | ORAL_TABLET | Freq: Two times a day (BID) | ORAL | Status: DC
  Administered 2023-09-23 – 2023-09-29 (×12): 200 mg via ORAL
  Filled 2023-09-22 (×16): qty 1

## 2023-09-22 MED ORDER — OXYCODONE HCL 5 MG PO TABS
10.0000 mg | ORAL_TABLET | ORAL | Status: DC | PRN
  Administered 2023-09-23 – 2023-09-24 (×5): 10 mg via ORAL
  Filled 2023-09-22 (×5): qty 2

## 2023-09-22 MED ORDER — METHOCARBAMOL 500 MG PO TABS
500.0000 mg | ORAL_TABLET | Freq: Three times a day (TID) | ORAL | Status: DC
  Administered 2023-09-23 (×2): 500 mg via ORAL
  Filled 2023-09-22 (×2): qty 1

## 2023-09-22 MED ORDER — POLYETHYLENE GLYCOL 3350 17 G PO PACK: 17.0000 g | PACK | Freq: Every day | ORAL | Status: DC | PRN

## 2023-09-22 MED ORDER — DOCUSATE SODIUM 100 MG PO CAPS
100.0000 mg | ORAL_CAPSULE | Freq: Two times a day (BID) | ORAL | Status: DC
  Administered 2023-09-23 – 2023-09-28 (×12): 100 mg via ORAL
  Filled 2023-09-22 (×13): qty 1

## 2023-09-22 MED ORDER — ENOXAPARIN SODIUM 30 MG/0.3ML IJ SOSY
30.0000 mg | PREFILLED_SYRINGE | Freq: Two times a day (BID) | INTRAMUSCULAR | Status: DC
  Administered 2023-09-23 – 2023-09-27 (×9): 30 mg via SUBCUTANEOUS
  Filled 2023-09-22 (×8): qty 0.3

## 2023-09-22 MED ORDER — IOHEXOL 350 MG/ML SOLN
60.0000 mL | Freq: Once | INTRAVENOUS | Status: AC | PRN
  Administered 2023-09-22: 60 mL via INTRAVENOUS

## 2023-09-22 NOTE — ED Notes (Signed)
 CCollar placed on pt after speaking to PA

## 2023-09-22 NOTE — ED Notes (Signed)
 This RN paged Ortho tech.
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
## 2023-09-22 NOTE — ED Notes (Signed)
 ED Provider at bedside.
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
## 2023-09-22 NOTE — Progress Notes (Signed)
 Orthopedic Tech Progress Note Patient Details:  Debra Welch 12/24/1965 [id number]  Ortho Devices Type of Ortho Device: Sugartong splint Ortho Device/Splint Location: LUE Ortho Device/Splint Interventions: Ordered, Application, Adjustment   Post Interventions Patient Tolerated: Fair Instructions Provided: Care of device Assisted with splint application. Toi Foster 09/22/2023, 11:20 PM

## 2023-09-23 LAB — CBC
HCT: 34.8 % — ABNORMAL LOW (ref 36.0–46.0)
Hemoglobin: 11.4 g/dL — ABNORMAL LOW (ref 12.0–15.0)
MCH: 28.8 pg (ref 26.0–34.0)
MCHC: 32.8 g/dL (ref 30.0–36.0)
MCV: 87.9 fL (ref 80.0–100.0)
Platelets: 182 10*3/uL (ref 150–400)
RBC: 3.96 MIL/uL (ref 3.87–5.11)
RDW: 15.5 % (ref 11.5–15.5)
WBC: 8.9 10*3/uL (ref 4.0–10.5)
nRBC: 0 % (ref 0.0–0.2)

## 2023-09-23 LAB — BASIC METABOLIC PANEL WITH GFR
Anion gap: 8 (ref 5–15)
BUN: 12 mg/dL (ref 6–20)
CO2: 27 mmol/L (ref 22–32)
Calcium: 8.6 mg/dL — ABNORMAL LOW (ref 8.9–10.3)
Chloride: 107 mmol/L (ref 98–111)
Creatinine, Ser: 1.58 mg/dL — ABNORMAL HIGH (ref 0.44–1.00)
GFR, Estimated: 38 mL/min — ABNORMAL LOW (ref >60)
Glucose, Bld: 137 mg/dL — ABNORMAL HIGH (ref 70–99)
Potassium: 3.9 mmol/L (ref 3.5–5.1)
Sodium: 142 mmol/L (ref 135–145)

## 2023-09-23 LAB — HIV ANTIBODY (ROUTINE TESTING W REFLEX): HIV Screen 4th Generation wRfx: NONREACTIVE

## 2023-09-23 MED ORDER — METHOCARBAMOL 1000 MG/10ML IJ SOLN
1000.0000 mg | Freq: Three times a day (TID) | INTRAMUSCULAR | Status: DC
  Administered 2023-09-23 – 2023-09-24 (×2): 1000 mg via INTRAVENOUS
  Filled 2023-09-23 (×2): qty 10

## 2023-09-23 MED ORDER — HYDROMORPHONE HCL 1 MG/ML IJ SOLN
1.0000 mg | INTRAMUSCULAR | Status: DC | PRN
  Administered 2023-09-23 – 2023-09-24 (×5): 1 mg via INTRAVENOUS
  Filled 2023-09-23 (×5): qty 1

## 2023-09-23 MED ORDER — TAMSULOSIN HCL 0.4 MG PO CAPS
0.4000 mg | ORAL_CAPSULE | Freq: Every day | ORAL | Status: DC
  Administered 2023-09-23 – 2023-09-29 (×7): 0.4 mg via ORAL
  Filled 2023-09-23 (×7): qty 1

## 2023-09-23 MED ORDER — PREGABALIN 50 MG PO CAPS
50.0000 mg | ORAL_CAPSULE | Freq: Two times a day (BID) | ORAL | Status: DC
  Administered 2023-09-23 – 2023-09-24 (×3): 50 mg via ORAL
  Filled 2023-09-23 (×3): qty 1

## 2023-09-23 MED ORDER — CHLORHEXIDINE GLUCONATE CLOTH 2 % EX PADS
6.0000 | MEDICATED_PAD | Freq: Every day | CUTANEOUS | Status: DC
  Administered 2023-09-24 – 2023-09-29 (×6): 6 via TOPICAL

## 2023-09-23 MED ORDER — SODIUM CHLORIDE 0.9 % IV BOLUS
500.0000 mL | Freq: Once | INTRAVENOUS | Status: AC
Start: 2023-09-23 — End: 2023-09-23
  Administered 2023-09-23: 500 mL via INTRAVENOUS
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
## 2023-09-23 NOTE — Progress Notes (Signed)
 Patient verbalized that she is not able to tolerate her left arm on sling arm elevator and c/o extreme pain. Arm is now elevated on 2 pillows which patient shared that she feels better but does continue to c/o pain. Medication admin per order.

## 2023-09-23 NOTE — Progress Notes (Signed)
   09/23/23 1601  Provider Notification  Provider Name/Title Trauma team Loetta Ringer  Date Provider Notified 09/23/23  Time Provider Notified 1601  Method of Notification  (secure chat)  Notification Reason Other (Comment) (unable to void)  Provider response See new orders  Date of Provider Response 09/23/23  Time of Provider Response 1601     Patient c/o need to empty her bladder but unable to void. Patient already had one occurrence of in and out catheter. Per trauma PA Loetta Ringer. Bladder scan and if greater than 300, please insert foley and trauma team will eval. POC updated w/ patient.
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
## 2023-09-23 NOTE — Progress Notes (Signed)
 Orthopedic Tech Progress Note Patient Details:  NAFIA MIESSE 02-Feb-1966 [id number]  Ortho Devices Type of Ortho Device: Ace wrap, Arm sling, Thoracolumbar corset (TLSO) Ortho Device/Splint Location: BACK, LUE Ortho Device/Splint Interventions: Ordered, Application, Adjustment   Post Interventions Patient Tolerated: Fair Instructions Provided: Care of device  Kermitt Pedlar 09/23/2023, 11:15 AM
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
## 2023-09-24 LAB — GLUCOSE, CAPILLARY: Glucose-Capillary: 106 mg/dL — ABNORMAL HIGH (ref 70–99)

## 2023-09-24 MED ORDER — OXYCODONE HCL 5 MG PO TABS
10.0000 mg | ORAL_TABLET | ORAL | Status: DC | PRN
  Administered 2023-09-24 (×2): 10 mg via ORAL
  Administered 2023-09-25: 15 mg via ORAL
  Administered 2023-09-25: 10 mg via ORAL
  Administered 2023-09-25 – 2023-09-26 (×4): 15 mg via ORAL
  Administered 2023-09-27: 10 mg via ORAL
  Administered 2023-09-27 – 2023-09-29 (×5): 15 mg via ORAL
  Filled 2023-09-24 (×4): qty 3
  Filled 2023-09-24: qty 2
  Filled 2023-09-24: qty 3
  Filled 2023-09-24: qty 2
  Filled 2023-09-24 (×6): qty 3
  Filled 2023-09-24 (×2): qty 2

## 2023-09-24 MED ORDER — METHOCARBAMOL 1000 MG/10ML IJ SOLN
1000.0000 mg | Freq: Four times a day (QID) | INTRAMUSCULAR | Status: DC
  Administered 2023-09-24 – 2023-09-29 (×18): 1000 mg via INTRAVENOUS
  Filled 2023-09-24 (×18): qty 10

## 2023-09-24 MED ORDER — SODIUM CHLORIDE 0.9 % IV BOLUS
500.0000 mL | Freq: Once | INTRAVENOUS | Status: AC
Start: 2023-09-24 — End: 2023-09-24
  Administered 2023-09-24: 500 mL via INTRAVENOUS

## 2023-09-24 MED ORDER — PREGABALIN 75 MG PO CAPS
75.0000 mg | ORAL_CAPSULE | Freq: Two times a day (BID) | ORAL | Status: DC
  Administered 2023-09-24 – 2023-09-29 (×10): 75 mg via ORAL
  Filled 2023-09-24 (×10): qty 1

## 2023-09-24 MED ORDER — LACTATED RINGERS IV BOLUS
500.0000 mL | Freq: Once | INTRAVENOUS | Status: AC
  Administered 2023-09-24: 500 mL via INTRAVENOUS
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
## 2023-09-24 NOTE — Plan of Care (Signed)
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
## 2023-09-24 NOTE — TOC CAGE-AID Note (Signed)
 Transition of Care [facility]) - CAGE-AID Screening   Patient Details  Name: Debra Welch MRN: [id number] Date of Birth: [birth year]  Transition of Care [facility]) CM/SW Contact:    Michae Grimley E Joni Colegrove, LCSW Phone Number: 09/24/2023, 10:14 AM   Clinical Narrative: Patient denied substance use or the need for resources.   CAGE-AID Screening:    Have You Ever Felt You Ought to Cut Down on Your Drinking or Drug Use?: No Have People Annoyed You By Critizing Your Drinking Or Drug Use?: No Have You Felt Bad Or Guilty About Your Drinking Or Drug Use?: No Have You Ever Had a Drink or Used Drugs First Thing In The Morning to Steady Your Nerves or to Get Rid of a Hangover?: No CAGE-AID Score: 0  Substance Abuse Education Offered: No
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
## 2023-09-25 ENCOUNTER — Inpatient Hospital Stay (HOSPITAL_COMMUNITY): Payer: MEDICAID

## 2023-09-25 LAB — URINALYSIS, ROUTINE W REFLEX MICROSCOPIC
Bilirubin Urine: NEGATIVE
Glucose, UA: NEGATIVE mg/dL
Ketones, ur: NEGATIVE mg/dL
Nitrite: NEGATIVE
Protein, ur: NEGATIVE mg/dL
Specific Gravity, Urine: 1.015 (ref 1.005–1.030)
pH: 5 (ref 5.0–8.0)

## 2023-09-25 LAB — CBC
HCT: 27.6 % — ABNORMAL LOW (ref 36.0–46.0)
Hemoglobin: 8.9 g/dL — ABNORMAL LOW (ref 12.0–15.0)
MCH: 28.4 pg (ref 26.0–34.0)
MCHC: 32.2 g/dL (ref 30.0–36.0)
MCV: 88.2 fL (ref 80.0–100.0)
Platelets: 155 10*3/uL (ref 150–400)
RBC: 3.13 MIL/uL — ABNORMAL LOW (ref 3.87–5.11)
RDW: 15.6 % — ABNORMAL HIGH (ref 11.5–15.5)
WBC: 6 10*3/uL (ref 4.0–10.5)
nRBC: 0 % (ref 0.0–0.2)

## 2023-09-25 LAB — BASIC METABOLIC PANEL WITH GFR
Anion gap: 7 (ref 5–15)
BUN: 21 mg/dL — ABNORMAL HIGH (ref 6–20)
CO2: 24 mmol/L (ref 22–32)
Calcium: 8.4 mg/dL — ABNORMAL LOW (ref 8.9–10.3)
Chloride: 108 mmol/L (ref 98–111)
Creatinine, Ser: 1.5 mg/dL — ABNORMAL HIGH (ref 0.44–1.00)
GFR, Estimated: 41 mL/min — ABNORMAL LOW (ref >60)
Glucose, Bld: 125 mg/dL — ABNORMAL HIGH (ref 70–99)
Potassium: 3.8 mmol/L (ref 3.5–5.1)
Sodium: 139 mmol/L (ref 135–145)

## 2023-09-25 LAB — GLUCOSE, CAPILLARY
Glucose-Capillary: 108 mg/dL — ABNORMAL HIGH (ref 70–99)
Glucose-Capillary: 118 mg/dL — ABNORMAL HIGH (ref 70–99)
Glucose-Capillary: 126 mg/dL — ABNORMAL HIGH (ref 70–99)

## 2023-09-25 MED ORDER — HYDROMORPHONE HCL 1 MG/ML IJ SOLN
0.5000 mg | Freq: Four times a day (QID) | INTRAMUSCULAR | Status: DC | PRN
  Administered 2023-09-25 – 2023-09-26 (×3): 0.5 mg via INTRAVENOUS
  Filled 2023-09-25 (×3): qty 0.5

## 2023-09-25 NOTE — Plan of Care (Signed)

## 2023-09-25 NOTE — Plan of Care (Signed)
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
## 2023-09-25 NOTE — Progress Notes (Signed)
 Inpatient Rehab Admissions Coordinator:   Per updated therapy recommendations pt was screened for CIR by Loye Rumble, PT, DPT.  Pt progressing with therapy from max +2 to EOB on eval to min/mod assist for transfers and short distance gait during today's session.  Currently no beds on CIR until mid next week.  We can rescreen early next week if she remains admitted but would expect that given age and PLOF she will continue to progress to be able to d/c home.    Loye Rumble, PT, DPT Admissions Coordinator [phone] 09/25/23  2:52 PM

## 2023-09-25 NOTE — Progress Notes (Signed)
  Progress Note   Date: 09/25/2023  Patient Name: Debra Welch        MRN#: [id number]  The diagnosis of AKI     Clinically Undetermined

## 2023-09-25 NOTE — TOC Progression Note (Signed)
 Transition of Care [facility]) - Progression Note    Patient Details  Name: Debra Welch MRN: [id number] Date of Birth: February 18, 1966  [facility]) CM/SW Contact  Tuleen Mandelbaum M, RN Phone Number: 09/25/2023, 3:59 PM  Clinical Narrative:    Patient declined BSC due to cost, and states she will order from [place].  She is not sure about OP therapy at dc, but will let us  know closer to dc.    Expected Discharge Plan: OP Rehab Barriers to Discharge: Continued Medical Work up         Living arrangements for the past 2 months: Single Family Home                                       Social Determinants of Health (SDOH) Interventions SDOH Screenings   Food Insecurity: No Food Insecurity (09/23/2023)  Housing: Low Risk  (09/23/2023)  Transportation Needs: No Transportation Needs (09/23/2023)  Utilities: Not At Risk (09/23/2023)  Tobacco Use: High Risk (08/28/2023)   Received from [facility]    Readmission Risk Interventions     No data to display         Calla Catchings, RN, BSN  Trauma/Neuro ICU Case Manager [phone]
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
## 2023-09-26 LAB — GLUCOSE, CAPILLARY
Glucose-Capillary: 162 mg/dL — ABNORMAL HIGH (ref 70–99)
Glucose-Capillary: 146 mg/dL — ABNORMAL HIGH (ref 70–99)
Glucose-Capillary: 130 mg/dL — ABNORMAL HIGH (ref 70–99)

## 2023-09-26 LAB — CBC
HCT: 27.8 % — ABNORMAL LOW (ref 36.0–46.0)
Hemoglobin: 9.1 g/dL — ABNORMAL LOW (ref 12.0–15.0)
MCH: 28.4 pg (ref 26.0–34.0)
MCHC: 32.7 g/dL (ref 30.0–36.0)
MCV: 86.9 fL (ref 80.0–100.0)
Platelets: 155 10*3/uL (ref 150–400)
RBC: 3.2 MIL/uL — ABNORMAL LOW (ref 3.87–5.11)
RDW: 15.2 % (ref 11.5–15.5)
WBC: 5.5 10*3/uL (ref 4.0–10.5)
nRBC: 0 % (ref 0.0–0.2)

## 2023-09-26 MED ORDER — POLYETHYLENE GLYCOL 3350 17 G PO PACK
17.0000 g | PACK | Freq: Two times a day (BID) | ORAL | Status: DC
  Administered 2023-09-26 – 2023-09-28 (×6): 17 g via ORAL
  Filled 2023-09-26 (×7): qty 1
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
## 2023-09-26 NOTE — Plan of Care (Signed)

## 2023-09-27 ENCOUNTER — Inpatient Hospital Stay (HOSPITAL_COMMUNITY): Payer: MEDICAID

## 2023-09-27 LAB — GLUCOSE, CAPILLARY
Glucose-Capillary: 133 mg/dL — ABNORMAL HIGH (ref 70–99)
Glucose-Capillary: 150 mg/dL — ABNORMAL HIGH (ref 70–99)

## 2023-09-27 MED ORDER — SENNA 8.6 MG PO TABS: 2.0000 | ORAL_TABLET | Freq: Once | ORAL | Status: DC

## 2023-09-27 MED ORDER — ENOXAPARIN SODIUM 40 MG/0.4ML IJ SOSY
40.0000 mg | PREFILLED_SYRINGE | Freq: Two times a day (BID) | INTRAMUSCULAR | Status: DC
  Administered 2023-09-27 – 2023-09-29 (×4): 40 mg via SUBCUTANEOUS
  Filled 2023-09-27 (×4): qty 0.4

## 2023-09-27 MED ORDER — SENNA 8.6 MG PO TABS
2.0000 | ORAL_TABLET | Freq: Once | ORAL | Status: AC
  Administered 2023-09-27: 17.2 mg via ORAL
  Filled 2023-09-27: qty 2

## 2023-09-27 MED ORDER — BISACODYL 10 MG RE SUPP
10.0000 mg | Freq: Once | RECTAL | Status: AC
  Administered 2023-09-27: 10 mg via RECTAL
  Filled 2023-09-27: qty 1

## 2023-09-27 MED ORDER — MAGNESIUM HYDROXIDE 400 MG/5ML PO SUSP
30.0000 mL | Freq: Once | ORAL | Status: AC
  Administered 2023-09-27: 30 mL via ORAL
  Filled 2023-09-27: qty 30

## 2023-09-27 MED ORDER — BISACODYL 5 MG PO TBEC
10.0000 mg | DELAYED_RELEASE_TABLET | Freq: Once | ORAL | Status: AC
  Administered 2023-09-27: 10 mg via ORAL
  Filled 2023-09-27: qty 2

## 2023-09-27 MED ORDER — MAGNESIUM CITRATE PO SOLN
1.0000 | Freq: Once | ORAL | Status: AC
  Administered 2023-09-27: 1 via ORAL
  Filled 2023-09-27 (×2): qty 296
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
## 2023-09-27 NOTE — Progress Notes (Signed)
 PT Cancellation Note  Patient Details Name: Debra Welch MRN: [id number] DOB: 06-08-1965   Cancelled Treatment:    Reason Eval/Treat Not Completed: Other (comment) attempted to see for mobility progression after pt pre-medicated by RN. Pt reports she is feeling too loopy from pain medication to participate, reports feeling "high as a kite" and agreeable to therapy returning later in the day.   Barnabas Booth, PT, DPT   Acute Rehabilitation Department Office [phone] Secure Chat Communication Preferred   Lona Rist 09/27/2023, 12:57 PM

## 2023-09-27 NOTE — Plan of Care (Signed)
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
## 2023-09-27 NOTE — Progress Notes (Signed)
 OT Cancellation Note  Patient Details Name: Debra Welch MRN: [id number] DOB: Dec 16, 1965   Cancelled Treatment:    Reason Eval/Treat Not Completed: Other (comment). Entering room with pt recently back to bed reporting that she has been in chair and to restroom (unsure accuracy of report). Pt reporting that she is "high as a kite" after having her medication with slowed pace of communication and reporting she is unable to get up right now. Will continue efforts.   Karilyn Ouch, OTR/L [facility] Acute Rehabilitation Office: [phone]   Debra Welch 09/27/2023, 12:57 PM
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
## 2023-09-28 LAB — CBC
HCT: 32.2 % — ABNORMAL LOW (ref 36.0–46.0)
Hemoglobin: 10.5 g/dL — ABNORMAL LOW (ref 12.0–15.0)
MCH: 28.5 pg (ref 26.0–34.0)
MCHC: 32.6 g/dL (ref 30.0–36.0)
MCV: 87.5 fL (ref 80.0–100.0)
Platelets: 198 10*3/uL (ref 150–400)
RBC: 3.68 MIL/uL — ABNORMAL LOW (ref 3.87–5.11)
RDW: 15.4 % (ref 11.5–15.5)
WBC: 4.9 10*3/uL (ref 4.0–10.5)
nRBC: 1.2 % — ABNORMAL HIGH (ref 0.0–0.2)

## 2023-09-28 LAB — GLUCOSE, CAPILLARY
Glucose-Capillary: 130 mg/dL — ABNORMAL HIGH (ref 70–99)
Glucose-Capillary: 107 mg/dL — ABNORMAL HIGH (ref 70–99)

## 2023-09-28 MED ORDER — MAGNESIUM CITRATE PO SOLN
1.0000 | Freq: Once | ORAL | Status: AC
  Administered 2023-09-28: 1 via ORAL
  Filled 2023-09-28: qty 296

## 2023-09-28 MED ORDER — BISACODYL 10 MG RE SUPP: 10.0000 mg | Freq: Once | RECTAL | Status: DC

## 2023-09-28 MED ORDER — SENNA 8.6 MG PO TABS
2.0000 | ORAL_TABLET | Freq: Every day | ORAL | Status: DC
  Administered 2023-09-28: 17.2 mg via ORAL
  Filled 2023-09-28: qty 2
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
## 2023-09-28 NOTE — Progress Notes (Signed)
 Inpatient Rehab Admissions Coordinator:    PT now recommending outpatient therapy. CIR will not pursue an admission.  Wandalee Gust, MS, CCC-SLP Rehab Admissions Coordinator  [phone] (celll) [phone] (office)

## 2023-09-28 NOTE — Plan of Care (Signed)

## 2023-09-28 NOTE — Progress Notes (Signed)
 Patient is alert and oriented x4. Can ambulate x1 standby. Refuses to use brace  when out of bed. Received oxy for pain x1. Gave miralax , colace and a dulcolax suppository to assist with constipation. Patient still was unable to produce a good bowel movement. She expelled a few type 1 stool. Bowel sounds are hyperactive but cannot produce a satisfactory bowel movement. Patient denied additional needs.

## 2023-09-28 NOTE — TOC Progression Note (Signed)
 Transition of Care [facility]) - Progression Note    Patient Details  Name: Debra Welch MRN: [id number] Date of Birth: 11-24-1965  Transition of Care [facility]) CM/SW Contact  Dywane Peruski M, RN Phone Number: 09/28/2023, 5:02 PM  Clinical Narrative:    Patient declines inpatient rehab; referral made to [facility] on [facility] for physical and occupational therapy follow-up.   Expected Discharge Plan: OP Rehab Barriers to Discharge: Continued Medical Work up      Living arrangements for the past 2 months: Single Family Home Expected Discharge Date: 09/29/23                                     Social Determinants of Health (SDOH) Interventions SDOH Screenings   Food Insecurity: No Food Insecurity (09/23/2023)  Housing: Low Risk  (09/23/2023)  Transportation Needs: No Transportation Needs (09/23/2023)  Utilities: Not At Risk (09/23/2023)  Tobacco Use: High Risk (08/28/2023)   Received from [facility]    Readmission Risk Interventions     No data to display         Calla Catchings, RN, BSN  Trauma/Neuro ICU Case Manager [phone]

## 2023-09-28 NOTE — Progress Notes (Signed)
 Orthopedic Tech Progress Note Patient Details:  Debra Welch Dec 16, 1965 [id number]  Casting Type of Cast: Long arm cast Cast Location: LUE Cast Material: Fiberglass Cast Intervention: Application  Post Interventions Patient Tolerated: Well Instructions Provided: Poper ambulation with device   Kaari Zeigler A Adelee Hannula 09/28/2023, 2:01 PM
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
## 2023-09-29 ENCOUNTER — Other Ambulatory Visit (HOSPITAL_BASED_OUTPATIENT_CLINIC_OR_DEPARTMENT_OTHER): Payer: Self-pay

## 2023-09-29 ENCOUNTER — Other Ambulatory Visit: Payer: Self-pay

## 2023-09-29 LAB — GLUCOSE, CAPILLARY: Glucose-Capillary: 120 mg/dL — ABNORMAL HIGH (ref 70–99)

## 2023-09-29 LAB — CBC
HCT: 30.1 % — ABNORMAL LOW (ref 36.0–46.0)
Hemoglobin: 9.9 g/dL — ABNORMAL LOW (ref 12.0–15.0)
MCH: 29 pg (ref 26.0–34.0)
MCHC: 32.9 g/dL (ref 30.0–36.0)
MCV: 88.3 fL (ref 80.0–100.0)
Platelets: 193 10*3/uL (ref 150–400)
RBC: 3.41 MIL/uL — ABNORMAL LOW (ref 3.87–5.11)
RDW: 15.7 % — ABNORMAL HIGH (ref 11.5–15.5)
WBC: 4.4 10*3/uL (ref 4.0–10.5)
nRBC: 1.4 % — ABNORMAL HIGH (ref 0.0–0.2)

## 2023-09-29 MED ORDER — TAMSULOSIN HCL 0.4 MG PO CAPS
0.4000 mg | ORAL_CAPSULE | Freq: Every day | ORAL | 0 refills | Status: AC
  Filled 2023-09-29: qty 3, 3d supply, fill #0

## 2023-09-29 MED ORDER — METHOCARBAMOL 750 MG PO TABS
750.0000 mg | ORAL_TABLET | Freq: Four times a day (QID) | ORAL | 1 refills | Status: AC
  Filled 2023-09-29 – 2024-03-30 (×2): qty 120, 30d supply, fill #0

## 2023-09-29 MED ORDER — ACETAMINOPHEN 500 MG PO TABS
1000.0000 mg | ORAL_TABLET | Freq: Four times a day (QID) | ORAL | 3 refills | Status: AC
  Filled 2023-09-29: qty 120, 15d supply, fill #0

## 2023-09-29 MED ORDER — OXYCODONE HCL 10 MG PO TABS
10.0000 mg | ORAL_TABLET | ORAL | 0 refills | Status: AC | PRN
  Filled 2023-09-29: qty 40, 5d supply, fill #0

## 2023-09-29 MED ORDER — DOCUSATE SODIUM 100 MG PO CAPS
100.0000 mg | ORAL_CAPSULE | Freq: Two times a day (BID) | ORAL | 1 refills | Status: AC
  Filled 2023-09-29: qty 60, 30d supply, fill #0

## 2023-09-29 MED ORDER — PEG 3350 17 GM/SCOOP PO POWD
17.0000 g | Freq: Two times a day (BID) | ORAL | 1 refills | Status: AC
  Filled 2023-09-29: qty 1020, 30d supply, fill #0
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
## 2023-09-29 NOTE — Plan of Care (Signed)
  Problem: Education: Goal: Knowledge of General Education information will improve Description: Including pain rating scale, medication(s)/side effects and non-pharmacologic comfort measures 09/29/2023 0315 by Alejos Husband, RN Outcome: Progressing 09/28/2023 2021 by Alejos Husband, RN Outcome: Progressing   Problem: Health Behavior/Discharge Planning: Goal: Ability to manage health-related needs will improve 09/29/2023 0315 by Alejos Husband, RN Outcome: Progressing 09/28/2023 2021 by Alejos Husband, RN Outcome: Progressing   Problem: Clinical Measurements: Goal: Ability to maintain clinical measurements within normal limits will improve 09/29/2023 0315 by Alejos Husband, RN Outcome: Progressing 09/28/2023 2021 by Alejos Husband, RN Outcome: Progressing Goal: Will remain free from infection 09/29/2023 0315 by Alejos Husband, RN Outcome: Progressing 09/28/2023 2021 by Alejos Husband, RN Outcome: Progressing Goal: Diagnostic test results will improve 09/29/2023 0315 by Alejos Husband, RN Outcome: Progressing 09/28/2023 2021 by Alejos Husband, RN Outcome: Progressing Goal: Respiratory complications will improve 09/29/2023 0315 by Alejos Husband, RN Outcome: Progressing 09/28/2023 2021 by Alejos Husband, RN Outcome: Progressing Goal: Cardiovascular complication will be avoided 09/29/2023 0315 by Alejos Husband, RN Outcome: Progressing 09/28/2023 2021 by Alejos Husband, RN Outcome: Progressing   Problem: Activity: Goal: Risk for activity intolerance will decrease 09/29/2023 0315 by Alejos Husband, RN Outcome: Progressing 09/28/2023 2021 by Alejos Husband, RN Outcome: Progressing   Problem: Nutrition: Goal: Adequate nutrition will be maintained 09/29/2023 0315 by Alejos Husband, RN Outcome: Progressing 09/28/2023 2021 by Alejos Husband, RN Outcome: Progressing   Problem: Coping: Goal: Level of anxiety will  decrease 09/29/2023 0315 by Alejos Husband, RN Outcome: Progressing 09/28/2023 2021 by Alejos Husband, RN Outcome: Progressing   Problem: Elimination: Goal: Will not experience complications related to bowel motility 09/29/2023 0315 by Alejos Husband, RN Outcome: Progressing 09/28/2023 2021 by Alejos Husband, RN Outcome: Progressing Goal: Will not experience complications related to urinary retention 09/29/2023 0315 by Alejos Husband, RN Outcome: Progressing 09/28/2023 2021 by Alejos Husband, RN Outcome: Progressing   Problem: Pain Managment: Goal: General experience of comfort will improve and/or be controlled 09/29/2023 0315 by Alejos Husband, RN Outcome: Progressing 09/28/2023 2021 by Alejos Husband, RN Outcome: Progressing   Problem: Safety: Goal: Ability to remain free from injury will improve 09/29/2023 0315 by Alejos Husband, RN Outcome: Progressing 09/28/2023 2021 by Alejos Husband, RN Outcome: Progressing   Problem: Skin Integrity: Goal: Risk for impaired skin integrity will decrease 09/29/2023 0315 by Alejos Husband, RN Outcome: Progressing 09/28/2023 2021 by Alejos Husband, RN Outcome: Progressing

## 2023-09-29 NOTE — Progress Notes (Signed)
 Waiting for patients daughter to arrive to do education and review discharge instructions
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
## 2023-10-01 ENCOUNTER — Emergency Department (HOSPITAL_COMMUNITY): Payer: MEDICAID

## 2023-10-01 ENCOUNTER — Emergency Department (HOSPITAL_COMMUNITY)
Admission: EM | Admit: 2023-10-01 | Discharge: 2023-10-01 | Disposition: A | Discharge status: Home / Self Care | Payer: MEDICAID | Attending: Emergency Medicine | Admitting: Emergency Medicine

## 2023-10-01 ENCOUNTER — Other Ambulatory Visit: Payer: Self-pay

## 2023-10-01 ENCOUNTER — Encounter (HOSPITAL_COMMUNITY): Payer: Self-pay | Admitting: Emergency Medicine

## 2023-10-01 DIAGNOSIS — R0602 Shortness of breath: Secondary | ICD-10-CM | POA: Diagnosis not present

## 2023-10-01 DIAGNOSIS — R0781 Pleurodynia: Secondary | ICD-10-CM | POA: Insufficient documentation

## 2023-10-01 LAB — RESP PANEL BY RT-PCR (RSV, FLU A&B, COVID)  RVPGX2
Influenza A by PCR: NEGATIVE
Influenza B by PCR: NEGATIVE
Resp Syncytial Virus by PCR: NEGATIVE
SARS Coronavirus 2 by RT PCR: NEGATIVE

## 2023-10-01 LAB — COMPREHENSIVE METABOLIC PANEL WITH GFR
ALT: 98 U/L — ABNORMAL HIGH (ref 0–44)
AST: 58 U/L — ABNORMAL HIGH (ref 15–41)
Albumin: 3.7 g/dL (ref 3.5–5.0)
Alkaline Phosphatase: 142 U/L — ABNORMAL HIGH (ref 38–126)
Anion gap: 10 (ref 5–15)
BUN: 12 mg/dL (ref 6–20)
CO2: 23 mmol/L (ref 22–32)
Calcium: 8.9 mg/dL (ref 8.9–10.3)
Chloride: 109 mmol/L (ref 98–111)
Creatinine, Ser: 1.02 mg/dL — ABNORMAL HIGH (ref 0.44–1.00)
GFR, Estimated: >60 mL/min (ref >60)
Glucose, Bld: 115 mg/dL — ABNORMAL HIGH (ref 70–99)
Potassium: 4 mmol/L (ref 3.5–5.1)
Sodium: 142 mmol/L (ref 135–145)
Total Bilirubin: 1.1 mg/dL (ref 0.0–1.2)
Total Protein: 6.6 g/dL (ref 6.5–8.1)

## 2023-10-01 LAB — CBC
HCT: 34.2 % — ABNORMAL LOW (ref 36.0–46.0)
Hemoglobin: 10.9 g/dL — ABNORMAL LOW (ref 12.0–15.0)
MCH: 28.5 pg (ref 26.0–34.0)
MCHC: 31.9 g/dL (ref 30.0–36.0)
MCV: 89.5 fL (ref 80.0–100.0)
Platelets: 248 10*3/uL (ref 150–400)
RBC: 3.82 MIL/uL — ABNORMAL LOW (ref 3.87–5.11)
RDW: 16.3 % — ABNORMAL HIGH (ref 11.5–15.5)
WBC: 5.3 10*3/uL (ref 4.0–10.5)
nRBC: 0.6 % — ABNORMAL HIGH (ref 0.0–0.2)

## 2023-10-01 MED ORDER — OXYCODONE HCL 5 MG PO TABS
10.0000 mg | ORAL_TABLET | Freq: Once | ORAL | Status: AC
  Administered 2023-10-01: 10 mg via ORAL
  Filled 2023-10-01: qty 2

## 2023-10-01 MED ORDER — ACETAMINOPHEN 500 MG PO TABS
1000.0000 mg | ORAL_TABLET | Freq: Once | ORAL | Status: AC
  Administered 2023-10-01: 1000 mg via ORAL
  Filled 2023-10-01: qty 2
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
## 2023-10-01 NOTE — ED Notes (Signed)
 Nt called CCMD@1 :04pm

## 2023-10-09 ENCOUNTER — Other Ambulatory Visit (HOSPITAL_BASED_OUTPATIENT_CLINIC_OR_DEPARTMENT_OTHER): Payer: Self-pay

## 2023-10-13 ENCOUNTER — Encounter (HOSPITAL_BASED_OUTPATIENT_CLINIC_OR_DEPARTMENT_OTHER): Payer: Self-pay | Admitting: Orthopedic Surgery

## 2023-10-13 ENCOUNTER — Other Ambulatory Visit: Payer: Self-pay

## 2023-10-13 ENCOUNTER — Other Ambulatory Visit: Payer: Self-pay | Admitting: Orthopedic Surgery
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
## 2023-10-14 ENCOUNTER — Ambulatory Visit (HOSPITAL_BASED_OUTPATIENT_CLINIC_OR_DEPARTMENT_OTHER)
Admission: RE | Admit: 2023-10-14 | Discharge: 2023-10-14 | Disposition: A | Discharge status: Home / Self Care | Attending: Orthopedic Surgery | Admitting: Orthopedic Surgery

## 2023-10-14 ENCOUNTER — Other Ambulatory Visit: Payer: Self-pay

## 2023-10-14 ENCOUNTER — Ambulatory Visit (HOSPITAL_BASED_OUTPATIENT_CLINIC_OR_DEPARTMENT_OTHER): Payer: Self-pay | Admitting: Anesthesiology

## 2023-10-14 ENCOUNTER — Encounter (HOSPITAL_BASED_OUTPATIENT_CLINIC_OR_DEPARTMENT_OTHER)
Admission: RE | Disposition: A | Discharge status: Home / Self Care | Payer: Self-pay | Source: Home / Self Care | Attending: Orthopedic Surgery

## 2023-10-14 ENCOUNTER — Other Ambulatory Visit (HOSPITAL_BASED_OUTPATIENT_CLINIC_OR_DEPARTMENT_OTHER): Payer: Self-pay

## 2023-10-14 ENCOUNTER — Encounter (HOSPITAL_BASED_OUTPATIENT_CLINIC_OR_DEPARTMENT_OTHER): Payer: Self-pay | Admitting: Orthopedic Surgery

## 2023-10-14 ENCOUNTER — Ambulatory Visit (HOSPITAL_COMMUNITY)

## 2023-10-14 DIAGNOSIS — E119 Type 2 diabetes mellitus without complications: Secondary | ICD-10-CM

## 2023-10-14 DIAGNOSIS — G5602 Carpal tunnel syndrome, left upper limb: Secondary | ICD-10-CM | POA: Diagnosis not present

## 2023-10-14 DIAGNOSIS — G4730 Sleep apnea, unspecified: Secondary | ICD-10-CM | POA: Diagnosis not present

## 2023-10-14 DIAGNOSIS — I10 Essential (primary) hypertension: Secondary | ICD-10-CM

## 2023-10-14 DIAGNOSIS — Z6841 Body Mass Index (BMI) 40.0 and over, adult: Secondary | ICD-10-CM | POA: Insufficient documentation

## 2023-10-14 DIAGNOSIS — S52572A Other intraarticular fracture of lower end of left radius, initial encounter for closed fracture: Secondary | ICD-10-CM | POA: Diagnosis present

## 2023-10-14 DIAGNOSIS — E66813 Obesity, class 3: Secondary | ICD-10-CM | POA: Insufficient documentation

## 2023-10-14 DIAGNOSIS — K219 Gastro-esophageal reflux disease without esophagitis: Secondary | ICD-10-CM | POA: Diagnosis not present

## 2023-10-14 DIAGNOSIS — Z01818 Encounter for other preprocedural examination: Secondary | ICD-10-CM

## 2023-10-14 DIAGNOSIS — G629 Polyneuropathy, unspecified: Secondary | ICD-10-CM | POA: Insufficient documentation

## 2023-10-14 DIAGNOSIS — N289 Disorder of kidney and ureter, unspecified: Secondary | ICD-10-CM | POA: Diagnosis not present

## 2023-10-14 DIAGNOSIS — Z87891 Personal history of nicotine dependence: Secondary | ICD-10-CM

## 2023-10-14 DIAGNOSIS — S52502A Unspecified fracture of the lower end of left radius, initial encounter for closed fracture: Secondary | ICD-10-CM | POA: Diagnosis not present

## 2023-10-14 HISTORY — PX: CARPAL TUNNEL RELEASE: SHX101

## 2023-10-14 HISTORY — DX: Type 2 diabetes mellitus without complications: E11.9

## 2023-10-14 HISTORY — PX: OPEN REDUCTION INTERNAL FIXATION (ORIF) DISTAL RADIAL FRACTURE: SHX5989

## 2023-10-14 LAB — GLUCOSE, CAPILLARY
Glucose-Capillary: 89 mg/dL (ref 70–99)
Glucose-Capillary: 94 mg/dL (ref 70–99)

## 2023-10-14 SURGERY — OPEN REDUCTION INTERNAL FIXATION (ORIF) DISTAL RADIUS FRACTURE
Anesthesia: General | Laterality: Left

## 2023-10-14 MED ORDER — ACETAMINOPHEN 500 MG PO TABS
ORAL_TABLET | ORAL | Status: AC
  Filled 2023-10-14: qty 2

## 2023-10-14 MED ORDER — FENTANYL CITRATE (PF) 100 MCG/2ML IJ SOLN
INTRAMUSCULAR | Status: AC
  Filled 2023-10-14: qty 2

## 2023-10-14 MED ORDER — ACETAMINOPHEN 500 MG PO TABS
1000.0000 mg | ORAL_TABLET | Freq: Once | ORAL | Status: AC
  Administered 2023-10-14: 1000 mg via ORAL

## 2023-10-14 MED ORDER — FENTANYL CITRATE (PF) 100 MCG/2ML IJ SOLN
50.0000 ug | Freq: Once | INTRAMUSCULAR | Status: AC
  Administered 2023-10-14: 50 ug via INTRAVENOUS

## 2023-10-14 MED ORDER — DEXAMETHASONE SODIUM PHOSPHATE 4 MG/ML IJ SOLN
INTRAMUSCULAR | Status: DC | PRN
  Administered 2023-10-14: 5 mg via INTRAVENOUS

## 2023-10-14 MED ORDER — AMISULPRIDE (ANTIEMETIC) 5 MG/2ML IV SOLN: 10.0000 mg | Freq: Once | INTRAVENOUS | Status: DC | PRN

## 2023-10-14 MED ORDER — CEFAZOLIN SODIUM-DEXTROSE 2-4 GM/100ML-% IV SOLN
INTRAVENOUS | Status: AC
  Filled 2023-10-14: qty 100

## 2023-10-14 MED ORDER — PROPOFOL 10 MG/ML IV BOLUS
INTRAVENOUS | Status: DC | PRN
  Administered 2023-10-14: 140 mg via INTRAVENOUS

## 2023-10-14 MED ORDER — MIDAZOLAM HCL 2 MG/2ML IJ SOLN
2.0000 mg | Freq: Once | INTRAMUSCULAR | Status: AC
  Administered 2023-10-14: 2 mg via INTRAVENOUS

## 2023-10-14 MED ORDER — SUCCINYLCHOLINE CHLORIDE 200 MG/10ML IV SOSY
PREFILLED_SYRINGE | INTRAVENOUS | Status: DC | PRN
  Administered 2023-10-14: 120 mg via INTRAVENOUS

## 2023-10-14 MED ORDER — LACTATED RINGERS IV SOLN: INTRAVENOUS | Status: DC

## 2023-10-14 MED ORDER — CLONIDINE HCL (ANALGESIA) 100 MCG/ML EP SOLN
EPIDURAL | Status: DC | PRN
Start: 2023-10-14 — End: 2023-10-14
  Administered 2023-10-14: 50 ug

## 2023-10-14 MED ORDER — ONDANSETRON HCL 4 MG/2ML IJ SOLN
INTRAMUSCULAR | Status: AC
  Filled 2023-10-14: qty 6

## 2023-10-14 MED ORDER — PHENYLEPHRINE 80 MCG/ML (10ML) SYRINGE FOR IV PUSH (FOR BLOOD PRESSURE SUPPORT)
PREFILLED_SYRINGE | INTRAVENOUS | Status: AC
  Filled 2023-10-14: qty 10

## 2023-10-14 MED ORDER — FENTANYL CITRATE (PF) 100 MCG/2ML IJ SOLN: 25.0000 ug | INTRAMUSCULAR | Status: DC | PRN

## 2023-10-14 MED ORDER — MIDAZOLAM HCL 2 MG/2ML IJ SOLN
INTRAMUSCULAR | Status: AC
  Filled 2023-10-14: qty 2

## 2023-10-14 MED ORDER — BUPIVACAINE-EPINEPHRINE (PF) 0.5% -1:200000 IJ SOLN
INTRAMUSCULAR | Status: DC | PRN
Start: 2023-10-14 — End: 2023-10-14
  Administered 2023-10-14: 30 mL via PERINEURAL

## 2023-10-14 MED ORDER — DEXMEDETOMIDINE HCL IN NACL 80 MCG/20ML IV SOLN
INTRAVENOUS | Status: AC
  Filled 2023-10-14: qty 20

## 2023-10-14 MED ORDER — LIDOCAINE HCL (CARDIAC) PF 100 MG/5ML IV SOSY
PREFILLED_SYRINGE | INTRAVENOUS | Status: DC | PRN
  Administered 2023-10-14: 100 mg via INTRAVENOUS

## 2023-10-14 MED ORDER — OXYCODONE HCL 5 MG PO TABS
5.0000 mg | ORAL_TABLET | Freq: Four times a day (QID) | ORAL | 0 refills | Status: AC | PRN
  Filled 2023-10-14: qty 20, 5d supply, fill #0

## 2023-10-14 MED ORDER — CEFAZOLIN SODIUM-DEXTROSE 2-4 GM/100ML-% IV SOLN
2.0000 g | INTRAVENOUS | Status: AC
  Administered 2023-10-14: 2 g via INTRAVENOUS

## 2023-10-14 MED ORDER — PHENYLEPHRINE HCL-NACL 20-0.9 MG/250ML-% IV SOLN
INTRAVENOUS | Status: DC | PRN
Start: 2023-10-14 — End: 2023-10-14
  Administered 2023-10-14: 50 ug/min via INTRAVENOUS

## 2023-10-14 MED ORDER — EPHEDRINE SULFATE (PRESSORS) 50 MG/ML IJ SOLN
INTRAMUSCULAR | Status: DC | PRN
Start: 2023-10-14 — End: 2023-10-14
  Administered 2023-10-14 (×2): 5 mg via INTRAVENOUS

## 2023-10-14 MED ORDER — DEXAMETHASONE SODIUM PHOSPHATE 10 MG/ML IJ SOLN
INTRAMUSCULAR | Status: AC
  Filled 2023-10-14: qty 3

## 2023-10-14 MED ORDER — FENTANYL CITRATE (PF) 100 MCG/2ML IJ SOLN
INTRAMUSCULAR | Status: DC | PRN
  Administered 2023-10-14: 25 ug via INTRAVENOUS

## 2023-10-14 MED ORDER — EPHEDRINE 5 MG/ML INJ
INTRAVENOUS | Status: AC
  Filled 2023-10-14: qty 5

## 2023-10-14 MED ORDER — PROPOFOL 500 MG/50ML IV EMUL
INTRAVENOUS | Status: AC
  Filled 2023-10-14: qty 50

## 2023-10-14 MED ORDER — LIDOCAINE 2% (20 MG/ML) 5 ML SYRINGE
INTRAMUSCULAR | Status: AC
  Filled 2023-10-14: qty 15

## 2023-10-14 MED ORDER — PHENYLEPHRINE HCL (PRESSORS) 10 MG/ML IV SOLN
INTRAVENOUS | Status: DC | PRN
  Administered 2023-10-14: 160 ug via INTRAVENOUS
  Administered 2023-10-14 (×4): 80 ug via INTRAVENOUS

## 2023-10-14 MED ORDER — ONDANSETRON HCL 4 MG/2ML IJ SOLN: 4.0000 mg | Freq: Once | INTRAMUSCULAR | Status: DC | PRN

## 2023-10-14 SURGICAL SUPPLY — 62 items
BAND RUBBER #18 3X1/16 STRL (MISCELLANEOUS) IMPLANT
BANDAGE GAUZE 1X75IN STRL (MISCELLANEOUS) IMPLANT
BIT DRILL SOLID 2.0X40MM (BIT) IMPLANT
BIT DRILL SOLID 2.5X40MM (BIT) IMPLANT
BLADE MINI RND TIP GREEN BEAV (BLADE) IMPLANT
BLADE SURG 15 STRL LF DISP TIS (BLADE) ×2 IMPLANT
BNDG COHESIVE 2X5 TAN ST LF (GAUZE/BANDAGES/DRESSINGS) IMPLANT
BNDG COHESIVE 4X5 TAN STRL LF (GAUZE/BANDAGES/DRESSINGS) ×2 IMPLANT
BNDG ESMARK 4X9 LF (GAUZE/BANDAGES/DRESSINGS) ×2 IMPLANT
BNDG GAUZE DERMACEA FLUFF 4 (GAUZE/BANDAGES/DRESSINGS) ×2 IMPLANT
BRUSH SCRUB EZ PLAIN DRY (MISCELLANEOUS) IMPLANT
CANISTER SUCT 1200ML W/VALVE (MISCELLANEOUS) ×2 IMPLANT
CHLORAPREP W/TINT 26 (MISCELLANEOUS) ×2 IMPLANT
CORD BIPOLAR FORCEPS 12FT (ELECTRODE) ×2 IMPLANT
COVER BACK TABLE 60X90IN (DRAPES) ×2 IMPLANT
COVER MAYO STAND STRL (DRAPES) ×2 IMPLANT
CUFF TOURN SGL QUICK 18X4 (TOURNIQUET CUFF) IMPLANT
CUFF TRNQT CYL 24X4X16.5-23 (TOURNIQUET CUFF) IMPLANT
DRAIN PENROSE .5X12 LATEX STL (DRAIN) IMPLANT
DRAPE C-ARM 42X72 X-RAY (DRAPES) ×2 IMPLANT
DRAPE EXTREMITY T 121X128X90 (DISPOSABLE) ×2 IMPLANT
DRAPE SURG 17X23 STRL (DRAPES) ×2 IMPLANT
DRSG ADAPTIC 3X8 NADH LF (GAUZE/BANDAGES/DRESSINGS) ×2 IMPLANT
DRSG EMULSION OIL 3X3 NADH (GAUZE/BANDAGES/DRESSINGS) ×2 IMPLANT
ELECTRODE REM PT RTRN 9FT ADLT (ELECTROSURGICAL) ×2 IMPLANT
GAUZE SPONGE 4X4 12PLY STRL LF (GAUZE/BANDAGES/DRESSINGS) ×2 IMPLANT
GLOVE BIO SURGEON STRL SZ7.5 (GLOVE) ×2 IMPLANT
GLOVE BIOGEL PI IND STRL 7.0 (GLOVE) ×2 IMPLANT
GLOVE BIOGEL PI IND STRL 8 (GLOVE) ×2 IMPLANT
GLOVE ECLIPSE 6.5 STRL STRAW (GLOVE) ×2 IMPLANT
GOWN STRL REUS W/ TWL LRG LVL3 (GOWN DISPOSABLE) ×4 IMPLANT
GOWN STRL REUS W/TWL XL LVL3 (GOWN DISPOSABLE) ×2 IMPLANT
GUIDE AIMING 1.5MM (WIRE) IMPLANT
NDL HYPO 25X1 1.5 SAFETY (NEEDLE) IMPLANT
NEEDLE HYPO 25X1 1.5 SAFETY (NEEDLE) IMPLANT
NS IRRIG 1000ML POUR BTL (IV SOLUTION) ×2 IMPLANT
PACK BASIN DAY SURGERY FS (CUSTOM PROCEDURE TRAY) ×2 IMPLANT
PADDING CAST ABS COTTON 4X4 ST (CAST SUPPLIES) IMPLANT
PEG GEMINUS SMOOTH LOCK 2.0X22 (Peg) IMPLANT
PEG GEMINUS SMOOTH LOCK 2.0X23 (Peg) IMPLANT
PEG SMOOTH LOCKING 20MM (Peg) IMPLANT
PEG SMOOTH LOCKING 21MM (Peg) IMPLANT
PENCIL SMOKE EVACUATOR (MISCELLANEOUS) ×2 IMPLANT
PLATE LEFT NARROW 3H (Plate) IMPLANT
SCREW GEMINUS CORT LOCK 3.5X11 (Screw) IMPLANT
SCREW NONLOCK POLYAXIAL 3.5X11 (Screw) IMPLANT
SCREWDRIVER SURG ST 2 (INSTRUMENTS) IMPLANT
SLEEVE SCD COMPRESS KNEE MED (STOCKING) ×2 IMPLANT
SLING ARM FOAM STRAP LRG (SOFTGOODS) IMPLANT
SPLINT PLASTER CAST XFAST 3X15 (CAST SUPPLIES) IMPLANT
STOCKINETTE 6 STRL (DRAPES) ×2 IMPLANT
SUCTION TUBE FRAZIER 10FR DISP (SUCTIONS) ×2 IMPLANT
SUT VIC AB 2-0 PS2 27 (SUTURE) ×2 IMPLANT
SUT VIC AB 4-0 PS2 18 (SUTURE) IMPLANT
SUT VICRYL RAPIDE 4-0 (SUTURE) IMPLANT
SUT VICRYL RAPIDE 4/0 PS 2 (SUTURE) ×2 IMPLANT
SYR 10ML LL (SYRINGE) IMPLANT
SYR BULB EAR ULCER 3OZ GRN STR (SYRINGE) ×2 IMPLANT
TOWEL GREEN STERILE FF (TOWEL DISPOSABLE) ×2 IMPLANT
TUBE CONNECTING 20X1/4 (TUBING) ×2 IMPLANT
UNDERPAD 30X36 HEAVY ABSORB (UNDERPADS AND DIAPERS) ×2 IMPLANT
WIRE FIX 1.5 STD TIP (WIRE) IMPLANT

## 2023-10-14 NOTE — Discharge Instructions (Addendum)
 Discharge Instructions   You have a dressing with a plaster splint incorporated in it. Move your fingers as much as possible, making a full fist and fully opening the fist. Elevate your hand to reduce pain & swelling of the digits.  Ice over the operative site may be helpful to reduce pain & swelling.  DO NOT USE HEAT. Take ibuprofen  600 mg and Tylenol  650 mg every 6 hours. Take the Oxycodone  5 mg additionally as prescribed for severe post operative pain. Leave the dressing in place until you return to our office.  You may shower, but keep the bandage clean & dry.  You may drive a car when you are off of prescription pain medications and can safely control your vehicle with both hands. Our office will call you to arrange follow-up   Please call [phone] during normal business hours or [phone] after hours for any problems. Including the following:  - excessive redness of the incisions - drainage for more than 4 days - fever of more than 101.5 F  *Please note that pain medications will not be refilled after hours or on weekends.   No lifting, gripping or grasping greater than paper and pencil tasks.    Post Anesthesia Home Care Instructions  Activity: Get plenty of rest for the remainder of the day. A responsible individual must stay with you for 24 hours following the procedure.  For the next 24 hours, DO NOT: -Drive a car -Advertising copywriter -Drink alcoholic beverages -Take any medication unless instructed by your physician -Make any legal decisions or sign important papers.  Meals: Start with liquid foods such as gelatin or soup. Progress to regular foods as tolerated. Avoid greasy, spicy, heavy foods. If nausea and/or vomiting occur, drink only clear liquids until the nausea and/or vomiting subsides. Call your physician if vomiting continues.  Special Instructions/Symptoms: Your throat may feel dry or sore from the anesthesia or the breathing tube placed in your  throat during surgery. If this causes discomfort, gargle with warm salt water. The discomfort should disappear within 24 hours.  May have Tylenol  after 4:15pm if needed.   Regional Anesthesia Blocks  1. You may not be able to move or feel the "blocked" extremity after a regional anesthetic block. This may last may last from 3-48 hours after placement, but it will go away. The length of time depends on the medication injected and your individual response to the medication. As the nerves start to wake up, you may experience tingling as the movement and feeling returns to your extremity. If the numbness and inability to move your extremity has not gone away after 48 hours, please call your surgeon.   2. The extremity that is blocked will need to be protected until the numbness is gone and the strength has returned. Because you cannot feel it, you will need to take extra care to avoid injury. Because it may be weak, you may have difficulty moving it or using it. You may not know what position it is in without looking at it while the block is in effect.  3. For blocks in the legs and feet, returning to weight bearing and walking needs to be done carefully. You will need to wait until the numbness is entirely gone and the strength has returned. You should be able to move your leg and foot normally before you try and bear weight or walk. You will need someone to be with you when you first try to ensure you do not  fall and possibly risk injury.  4. Bruising and tenderness at the needle site are common side effects and will resolve in a few days.  5. Persistent numbness or new problems with movement should be communicated to the surgeon or the [facility] [phone] [facility] [phone]).

## 2023-10-14 NOTE — Anesthesia Postprocedure Evaluation (Signed)
 Anesthesia Post Note  Patient: Debra Welch  Procedure(s) Performed: OPEN REDUCTION INTERNAL FIXATION (ORIF) DISTAL RADIUS FRACTURE (Left) CARPAL TUNNEL RELEASE (Left)     Patient location during evaluation: PACU Anesthesia Type: General Level of consciousness: awake and alert Pain management: pain level controlled Vital Signs Assessment: post-procedure vital signs reviewed and stable Respiratory status: spontaneous breathing, nonlabored ventilation and respiratory function stable Cardiovascular status: blood pressure returned to baseline and stable Postop Assessment: no apparent nausea or vomiting Anesthetic complications: no   No notable events documented.  Last Vitals:  Vitals:   10/14/23 1445 10/14/23 1505  BP: (!) 142/70 123/84  Pulse: 95 (!) 103  Resp: (!) 8 18  Temp:  (!) 36.2 C  SpO2: 92% 95%    Last Pain:  Vitals:   10/14/23 1505  TempSrc: Temporal  PainSc: 5                  Erin Havers
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
## 2023-10-14 NOTE — Anesthesia Preprocedure Evaluation (Addendum)
 Anesthesia Evaluation  Patient identified by MRN, date of birth, ID band Patient awake    Reviewed: Allergy & Precautions, NPO status , Patient's Chart, lab work & pertinent test results, reviewed documented beta blocker date and time   Airway Mallampati: III  TM Distance: >3 FB Neck ROM: Full    Dental  (+) Teeth Intact, Dental Advisory Given   Pulmonary sleep apnea , former smoker   Pulmonary exam normal breath sounds clear to auscultation       Cardiovascular hypertension, Pt. on home beta blockers Normal cardiovascular exam Rhythm:Regular Rate:Normal     Neuro/Psych  Headaches  negative psych ROS   GI/Hepatic Neg liver ROS,GERD  Medicated,,  Endo/Other  diabetes, Type 2, Oral Hypoglycemic Agents  Class 3 obesity  Renal/GU Renal InsufficiencyRenal disease     Musculoskeletal  (+) Arthritis ,  DISPLACED LEFT DISTAL RADIUS FRACTURE AND CARPAL TUNNEL SYNDROME   Abdominal   Peds  Hematology  (+) Blood dyscrasia, anemia   Anesthesia Other Findings Day of surgery medications reviewed with the patient.  Reproductive/Obstetrics                             Anesthesia Physical Anesthesia Plan  ASA: 3  Anesthesia Plan: General   Post-op Pain Management: Regional block* and Tylenol  PO (pre-op)*   Induction: Intravenous  PONV Risk Score and Plan: 2 and Midazolam , Dexamethasone  and Ondansetron   Airway Management Planned: Oral ETT  Additional Equipment:   Intra-op Plan:   Post-operative Plan: Extubation in OR  Informed Consent: I have reviewed the patients History and Physical, chart, labs and discussed the procedure including the risks, benefits and alternatives for the proposed anesthesia with the patient or authorized representative who has indicated his/her understanding and acceptance.     Dental advisory given  Plan Discussed with: CRNA  Anesthesia Plan Comments:          Anesthesia Quick Evaluation

## 2023-10-14 NOTE — Anesthesia Procedure Notes (Signed)
 Procedure Name: Intubation Date/Time: 10/14/2023 12:09 PM  Performed by: Lonia Ro, CRNAPre-anesthesia Checklist: Patient identified, Emergency Drugs available, Suction available, Patient being monitored and Timeout performed Patient Re-evaluated:Patient Re-evaluated prior to induction Oxygen Delivery Method: Circle system utilized Preoxygenation: Pre-oxygenation with 100% oxygen Induction Type: IV induction Laryngoscope Size: Mac and 3 Grade View: Grade I Tube type: Oral Tube size: 7.0 mm Number of attempts: 1 Airway Equipment and Method: Stylet Placement Confirmation: ETT inserted through vocal cords under direct vision, positive ETCO2 and breath sounds checked- equal and bilateral Secured at: 23 cm Tube secured with: Tape Dental Injury: Teeth and Oropharynx as per pre-operative assessment  Difficulty Due To: Difficult Airway- due to large tongue

## 2023-10-14 NOTE — Progress Notes (Signed)
Assisted Dr. Desmond Lope with left, supraclavicular, ultrasound guided block. Side rails up, monitors on throughout procedure. See vital signs in flow sheet. Tolerated Procedure well.

## 2023-10-14 NOTE — Transfer of Care (Signed)
 Immediate Anesthesia Transfer of Care Note  Patient: Debra Welch  Procedure(s) Performed: OPEN REDUCTION INTERNAL FIXATION (ORIF) DISTAL RADIUS FRACTURE (Left) CARPAL TUNNEL RELEASE (Left)  Patient Location: PACU  Anesthesia Type:General and Regional  Level of Consciousness: awake and patient cooperative  Airway & Oxygen Therapy: Patient Spontanous Breathing and Patient connected to nasal cannula oxygen  Post-op Assessment: Report given to RN and Post -op Vital signs reviewed and stable  Post vital signs: Reviewed and stable  Last Vitals:  Vitals Value Taken Time  BP    Temp    Pulse 100 10/14/23 1354  Resp 17 10/14/23 1354  SpO2 93 % 10/14/23 1354  Vitals shown include unfiled device data.  Last Pain:  Vitals:   10/14/23 1007  TempSrc: Temporal  PainSc: 7          Complications: No notable events documented.

## 2023-10-14 NOTE — Anesthesia Procedure Notes (Signed)
 Anesthesia Regional Block: Supraclavicular block   Pre-Anesthetic Checklist: , timeout performed,  Correct Patient, Correct Site, Correct Laterality,  Correct Procedure, Correct Position, site marked,  Risks and benefits discussed,  Surgical consent,  Pre-op evaluation,  At surgeon's request and post-op pain management  Laterality: Left  Prep: chloraprep       Needles:  Injection technique: Single-shot  Needle Type: Echogenic Needle     Needle Length: 9cm  Needle Gauge: 21     Additional Needles:   Procedures:,,,, ultrasound used (permanent image in chart),,    Narrative:  Start time: 10/14/2023 10:15 AM End time: 10/14/2023 10:22 AM Injection made incrementally with aspirations every 5 mL.  Performed by: Personally  Anesthesiologist: Erin Havers, MD  Additional Notes: No pain on injection. No increased resistance to injection. Injection made in 5cc increments.  Good needle visualization.  Patient tolerated procedure well.

## 2023-10-14 NOTE — Interval H&P Note (Signed)
 History and Physical Interval Note:  10/14/2023 11:57 AM  Debra Welch  has presented today for surgery, with the diagnosis of DISPLACED LEFT DISTAL RADIUS FRACTURE AND CARPAL TUNNEL SYNDROME.  The various methods of treatment have been discussed with the patient and family. After consideration of risks, benefits and other options for treatment, the patient has consented to  Procedure(s) with comments: OPEN REDUCTION INTERNAL FIXATION (ORIF) DISTAL RADIUS FRACTURE (Left) - OPEN TREATMENT OF LEFT COMMINUTED INTRA-ARTICULAR DISTAL RADIUS FRACTURE AND OPEN CARPAL TUNNEL RELEASE. CARPAL TUNNEL RELEASE (Left) as a surgical intervention.  The patient's history has been reviewed, patient examined, no change in status, stable for surgery.  I have reviewed the patient's chart and labs.  Questions were answered to the patient's satisfaction.     Sheryl Donna

## 2023-10-15 ENCOUNTER — Encounter (HOSPITAL_BASED_OUTPATIENT_CLINIC_OR_DEPARTMENT_OTHER): Payer: Self-pay | Admitting: Orthopedic Surgery
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
## 2023-10-20 ENCOUNTER — Encounter: Payer: Self-pay | Admitting: Physical Therapy

## 2023-10-20 ENCOUNTER — Ambulatory Visit: Payer: MEDICAID | Admitting: Physical Therapy

## 2023-10-20 DIAGNOSIS — M25532 Pain in left wrist: Secondary | ICD-10-CM | POA: Insufficient documentation

## 2023-10-20 DIAGNOSIS — R278 Other lack of coordination: Secondary | ICD-10-CM | POA: Insufficient documentation

## 2023-10-20 DIAGNOSIS — M6281 Muscle weakness (generalized): Secondary | ICD-10-CM | POA: Insufficient documentation

## 2023-10-20 DIAGNOSIS — R208 Other disturbances of skin sensation: Secondary | ICD-10-CM | POA: Insufficient documentation

## 2023-10-20 DIAGNOSIS — M25632 Stiffness of left wrist, not elsewhere classified: Secondary | ICD-10-CM | POA: Insufficient documentation

## 2023-10-20 DIAGNOSIS — M546 Pain in thoracic spine: Secondary | ICD-10-CM | POA: Insufficient documentation

## 2023-10-20 DIAGNOSIS — M5459 Other low back pain: Secondary | ICD-10-CM

## 2023-10-20 DIAGNOSIS — R600 Localized edema: Secondary | ICD-10-CM | POA: Insufficient documentation
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
## 2023-10-21 ENCOUNTER — Ambulatory Visit: Attending: Surgery | Admitting: Occupational Therapy

## 2023-10-21 DIAGNOSIS — R600 Localized edema: Secondary | ICD-10-CM | POA: Diagnosis not present

## 2023-10-21 DIAGNOSIS — M25532 Pain in left wrist: Secondary | ICD-10-CM

## 2023-10-21 DIAGNOSIS — M5459 Other low back pain: Secondary | ICD-10-CM | POA: Diagnosis not present

## 2023-10-21 DIAGNOSIS — M25632 Stiffness of left wrist, not elsewhere classified: Secondary | ICD-10-CM | POA: Diagnosis present

## 2023-10-21 DIAGNOSIS — R278 Other lack of coordination: Secondary | ICD-10-CM

## 2023-10-21 DIAGNOSIS — R208 Other disturbances of skin sensation: Secondary | ICD-10-CM | POA: Diagnosis not present

## 2023-10-21 DIAGNOSIS — M6281 Muscle weakness (generalized): Secondary | ICD-10-CM

## 2023-10-21 DIAGNOSIS — M546 Pain in thoracic spine: Secondary | ICD-10-CM | POA: Diagnosis not present

## 2023-10-21 NOTE — Patient Instructions (Signed)
 Flexor Tendon Gliding (Active Hook Fist)   With fingers and knuckles straight, bend middle and tip joints. Do not bend large knuckles. Repeat _10-15___ times. Do _4-6___ sessions per day.  MP Flexion (Active)   With back of hand on table, bend large knuckles as far as they will go, keeping small joints straight. Repeat _10-15___ times. Do __4-6__ sessions per day.      Finger Flexion / Extension   Bend fingers of left hand toward palm, making a  fist. Straighten fingers, opening fist. Repeat sequence _10-15___ times per session. Do _4-6__ sessions per day.   Thumb Circles    Move left thumb in a circle. Repeat _10___ times each direction per session.

## 2023-10-22 ENCOUNTER — Other Ambulatory Visit (HOSPITAL_BASED_OUTPATIENT_CLINIC_OR_DEPARTMENT_OTHER): Payer: Self-pay

## 2023-10-30 ENCOUNTER — Other Ambulatory Visit (HOSPITAL_BASED_OUTPATIENT_CLINIC_OR_DEPARTMENT_OTHER): Payer: Self-pay

## 2023-10-30 MED ORDER — FENOFIBRATE 160 MG PO TABS
160.0000 mg | ORAL_TABLET | Freq: Every day | ORAL | 1 refills | Status: DC
  Filled 2023-10-30: qty 90, 90d supply, fill #0

## 2023-10-31 ENCOUNTER — Other Ambulatory Visit (HOSPITAL_BASED_OUTPATIENT_CLINIC_OR_DEPARTMENT_OTHER): Payer: Self-pay

## 2023-11-02 ENCOUNTER — Other Ambulatory Visit (HOSPITAL_BASED_OUTPATIENT_CLINIC_OR_DEPARTMENT_OTHER): Payer: Self-pay

## 2023-11-04 ENCOUNTER — Other Ambulatory Visit (HOSPITAL_BASED_OUTPATIENT_CLINIC_OR_DEPARTMENT_OTHER): Payer: Self-pay

## 2023-11-04 ENCOUNTER — Ambulatory Visit: Admitting: Occupational Therapy

## 2023-11-04 ENCOUNTER — Encounter: Payer: Self-pay | Admitting: Occupational Therapy

## 2023-11-04 DIAGNOSIS — M25532 Pain in left wrist: Secondary | ICD-10-CM

## 2023-11-04 DIAGNOSIS — R278 Other lack of coordination: Secondary | ICD-10-CM

## 2023-11-04 DIAGNOSIS — M6281 Muscle weakness (generalized): Secondary | ICD-10-CM

## 2023-11-04 DIAGNOSIS — R600 Localized edema: Secondary | ICD-10-CM

## 2023-11-04 DIAGNOSIS — R208 Other disturbances of skin sensation: Secondary | ICD-10-CM

## 2023-11-04 DIAGNOSIS — M25632 Stiffness of left wrist, not elsewhere classified: Secondary | ICD-10-CM

## 2023-11-04 NOTE — Patient Instructions (Signed)
 AROM: Wrist Extension    With LEFT palm down, bend wrist up. Repeat __10__ times per set.  Do __6__ sessions per day.   AROM: Wrist Flexion    With LEFT palm up, bend wrist up. Repeat __10__ times per set.  Do __6__ sessions per day.   AROM: Wrist Radial / Ulnar Deviation    Gently bend left wrist from side to side as far as possible. Repeat __10__ times per set.  Do __6__ sessions per day.   Composite Extension (Passive Flexor Stretch)    Sitting with elbows on table and palms together, slowly lower wrists toward table until stretch is felt. Be sure to keep palms together throughout stretch. Hold __20__ seconds. Relax. Repeat __5__ times. Do __4-6__ sessions per day.   Flexion (Passive)    With elbow STRAIGHT, let wrist drop down. Apply gentle down-ward push with fingers of other hand. Hold __20__ seconds. Repeat __5__ times. Do __4-6__ sessions per day.
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
## 2023-11-05 ENCOUNTER — Other Ambulatory Visit (HOSPITAL_BASED_OUTPATIENT_CLINIC_OR_DEPARTMENT_OTHER): Payer: Self-pay

## 2023-11-05 ENCOUNTER — Ambulatory Visit: Admitting: Occupational Therapy

## 2023-11-05 ENCOUNTER — Encounter: Payer: Self-pay | Admitting: Occupational Therapy

## 2023-11-05 DIAGNOSIS — R600 Localized edema: Secondary | ICD-10-CM

## 2023-11-05 DIAGNOSIS — R278 Other lack of coordination: Secondary | ICD-10-CM

## 2023-11-05 DIAGNOSIS — M25632 Stiffness of left wrist, not elsewhere classified: Secondary | ICD-10-CM | POA: Diagnosis not present

## 2023-11-05 DIAGNOSIS — R208 Other disturbances of skin sensation: Secondary | ICD-10-CM

## 2023-11-05 DIAGNOSIS — M25532 Pain in left wrist: Secondary | ICD-10-CM

## 2023-11-05 DIAGNOSIS — M6281 Muscle weakness (generalized): Secondary | ICD-10-CM

## 2023-11-05 MED ORDER — MOUNJARO 7.5 MG/0.5ML ~~LOC~~ SOAJ
7.5000 mg | SUBCUTANEOUS | 0 refills | Status: DC
  Filled 2023-11-05 – 2023-11-16 (×2): qty 2, 28d supply, fill #0

## 2023-11-05 MED ORDER — FENOFIBRATE 160 MG PO TABS
160.0000 mg | ORAL_TABLET | Freq: Every day | ORAL | 1 refills | Status: DC
  Filled 2023-11-05 – 2023-11-16 (×2): qty 90, 90d supply, fill #0

## 2023-11-05 NOTE — Patient Instructions (Addendum)
 Combination Movement (Active)    Keep elbow bent firmly at side with wrist straight. Slowly turn palm up and then down. Repeat __10__ times each way. Do __4-6__ sessions per day.    Safety considerations for loss of sensation:   Look at affected hand when using it!   Do NOT use affected arm for anything: sharp, hot, breakable, or too heavy  Always check temperature of water (for showering, washing dishes, etc) with UNaffected arm/extremity  Consider travel mugs w/ lids to transport hot liquids/coffee  Consider alternative options and/or adaptive equipment to make things safer (ex: hand chopper or cut resistant glove for chopping vegetables, long silicone oven mitt, oven rack pull)   Avoid cold temperatures as well (wear glove in cold temperatures, get ice w/ unaffected extremity)  AVOID handling chemicals and machinery
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
## 2023-11-06 ENCOUNTER — Other Ambulatory Visit (HOSPITAL_BASED_OUTPATIENT_CLINIC_OR_DEPARTMENT_OTHER): Payer: Self-pay

## 2023-11-10 ENCOUNTER — Ambulatory Visit: Admitting: Occupational Therapy

## 2023-11-10 DIAGNOSIS — R278 Other lack of coordination: Secondary | ICD-10-CM

## 2023-11-10 DIAGNOSIS — R208 Other disturbances of skin sensation: Secondary | ICD-10-CM

## 2023-11-10 DIAGNOSIS — M25632 Stiffness of left wrist, not elsewhere classified: Secondary | ICD-10-CM | POA: Diagnosis not present

## 2023-11-10 DIAGNOSIS — M25532 Pain in left wrist: Secondary | ICD-10-CM

## 2023-11-10 DIAGNOSIS — R600 Localized edema: Secondary | ICD-10-CM

## 2023-11-10 DIAGNOSIS — M6281 Muscle weakness (generalized): Secondary | ICD-10-CM

## 2023-11-10 NOTE — Patient Instructions (Signed)
Swelling Reduction  Place affected area above the level of your heart. You can use pillows for support.  Move the affected area. Think about tightening the muscles in this area and then relaxing them.  "Pet" the area from the outside of your body toward the center of your body. If it is your hand, from the tips of your fingers to the shoulder or chest.    
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
## 2023-11-12 ENCOUNTER — Encounter: Payer: Self-pay | Admitting: Physical Therapy

## 2023-11-12 ENCOUNTER — Ambulatory Visit: Admitting: Occupational Therapy

## 2023-11-12 ENCOUNTER — Ambulatory Visit: Admitting: Physical Therapy

## 2023-11-12 DIAGNOSIS — R600 Localized edema: Secondary | ICD-10-CM

## 2023-11-12 DIAGNOSIS — M25532 Pain in left wrist: Secondary | ICD-10-CM

## 2023-11-12 DIAGNOSIS — R278 Other lack of coordination: Secondary | ICD-10-CM

## 2023-11-12 DIAGNOSIS — R208 Other disturbances of skin sensation: Secondary | ICD-10-CM

## 2023-11-12 DIAGNOSIS — M25632 Stiffness of left wrist, not elsewhere classified: Secondary | ICD-10-CM | POA: Diagnosis not present

## 2023-11-12 DIAGNOSIS — M6281 Muscle weakness (generalized): Secondary | ICD-10-CM

## 2023-11-12 DIAGNOSIS — M546 Pain in thoracic spine: Secondary | ICD-10-CM

## 2023-11-12 NOTE — Therapy (Signed)
 Patient Name: Debra Welch MRN: [id number] DOB:[birth year], 58 y.o., female Today's Date: 11/12/2023   PT End of Session - 11/12/23 1451     Visit Number 1    PT Start Time 1448    PT Stop Time 1454    PT Time Calculation (min) 6 min    Activity Tolerance Patient tolerated treatment well    Behavior During Therapy [facility] for tasks assessed/performed          Pt presents to scheduled PT evaluation wearing splint on L wrist. Pt reports she is not having any back pain and does not want PT at this time. Pt reports she only came to PT appointment because she has OT afterwards. Informed pt on how to return to PT in future if she begins to have pain, pt verbalized understanding.   Bladyn Tipps E Jaevion Goto, PT, DPT 11/12/2023, 2:58 PM
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
## 2023-11-14 ENCOUNTER — Other Ambulatory Visit (HOSPITAL_BASED_OUTPATIENT_CLINIC_OR_DEPARTMENT_OTHER): Payer: Self-pay

## 2023-11-16 ENCOUNTER — Other Ambulatory Visit (HOSPITAL_BASED_OUTPATIENT_CLINIC_OR_DEPARTMENT_OTHER): Payer: Self-pay

## 2023-11-17 ENCOUNTER — Ambulatory Visit: Attending: Surgery | Admitting: Occupational Therapy

## 2023-11-17 ENCOUNTER — Other Ambulatory Visit (HOSPITAL_COMMUNITY): Payer: Self-pay

## 2023-11-17 ENCOUNTER — Other Ambulatory Visit (HOSPITAL_BASED_OUTPATIENT_CLINIC_OR_DEPARTMENT_OTHER): Payer: Self-pay

## 2023-11-17 ENCOUNTER — Encounter: Payer: Self-pay | Admitting: Occupational Therapy

## 2023-11-17 DIAGNOSIS — M25632 Stiffness of left wrist, not elsewhere classified: Secondary | ICD-10-CM | POA: Insufficient documentation

## 2023-11-17 DIAGNOSIS — R208 Other disturbances of skin sensation: Secondary | ICD-10-CM | POA: Diagnosis present

## 2023-11-17 DIAGNOSIS — R278 Other lack of coordination: Secondary | ICD-10-CM | POA: Insufficient documentation

## 2023-11-17 DIAGNOSIS — R600 Localized edema: Secondary | ICD-10-CM | POA: Insufficient documentation

## 2023-11-17 DIAGNOSIS — M6281 Muscle weakness (generalized): Secondary | ICD-10-CM | POA: Insufficient documentation

## 2023-11-17 DIAGNOSIS — M25532 Pain in left wrist: Secondary | ICD-10-CM | POA: Diagnosis present

## 2023-11-17 MED ORDER — FENOFIBRATE 145 MG PO TABS
145.0000 mg | ORAL_TABLET | Freq: Every day | ORAL | 3 refills | Status: AC
  Filled 2023-11-17 – 2023-11-30 (×2): qty 90, 90d supply, fill #0
  Filled 2024-03-15 – 2024-05-20 (×2): qty 90, 90d supply, fill #1

## 2023-11-17 NOTE — Patient Instructions (Signed)
1. Grip Strengthening (Resistive Putty)   Squeeze putty using thumb and all fingers. Repeat _20___ times. Do __2__ sessions per day.   2. Roll putty into tube on table and pinch between first two fingers and thumb x 10 reps. Do 2 sessions per day     Copyright  VHI. All rights reserved.     
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
## 2023-11-18 ENCOUNTER — Ambulatory Visit: Payer: Self-pay | Admitting: Occupational Therapy

## 2023-11-18 ENCOUNTER — Encounter: Payer: Self-pay | Admitting: Occupational Therapy

## 2023-11-18 DIAGNOSIS — R600 Localized edema: Secondary | ICD-10-CM

## 2023-11-18 DIAGNOSIS — M6281 Muscle weakness (generalized): Secondary | ICD-10-CM

## 2023-11-18 DIAGNOSIS — R208 Other disturbances of skin sensation: Secondary | ICD-10-CM

## 2023-11-18 DIAGNOSIS — M25632 Stiffness of left wrist, not elsewhere classified: Secondary | ICD-10-CM

## 2023-11-18 DIAGNOSIS — R278 Other lack of coordination: Secondary | ICD-10-CM

## 2023-11-18 DIAGNOSIS — M25532 Pain in left wrist: Secondary | ICD-10-CM

## 2023-11-18 NOTE — Patient Instructions (Signed)
 Contrast Bath  Prepare the baths.  Cold = 55-65 degrees     Hot = 105-110 degrees  Starting with the hot, dip hand or foot all the way into the water and hold there for selected duration.  Preferably 3 minutes.  After selected duration is up, dip hand or foot into the cold for 1/3 duration of the hot. (3 minutes hot, 1 minute cold)  Alternate back and forth for the times indicated for no more than a total of 20 minutes ending with hot.   Wear compression glove at night as needed  Elevate hand when seated  Continue retrograde massage beginning at fingertips and working down into wrist  Continue tendon gliding ex's
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
## 2023-11-19 ENCOUNTER — Other Ambulatory Visit (HOSPITAL_BASED_OUTPATIENT_CLINIC_OR_DEPARTMENT_OTHER): Payer: Self-pay

## 2023-11-19 ENCOUNTER — Encounter: Admitting: Occupational Therapy

## 2023-11-21 ENCOUNTER — Other Ambulatory Visit (HOSPITAL_BASED_OUTPATIENT_CLINIC_OR_DEPARTMENT_OTHER): Payer: Self-pay

## 2023-11-23 ENCOUNTER — Other Ambulatory Visit (HOSPITAL_BASED_OUTPATIENT_CLINIC_OR_DEPARTMENT_OTHER): Payer: Self-pay

## 2023-11-24 ENCOUNTER — Telehealth: Payer: Self-pay | Admitting: Occupational Therapy

## 2023-11-24 ENCOUNTER — Ambulatory Visit: Admitting: Occupational Therapy

## 2023-11-24 NOTE — Telephone Encounter (Signed)
 Called and spoke to patient re: today's missed O.T. appointment (11/24/23 at 11:00). She thought it was tomorrow (Wed) and Thursday. Reminded her of next appointment on Thursday, 11/26/23 at 11:00. Pt aware.  She also had a question about her skin after removing K-tape, reporting a yellow spot but denies fluid, puss, or any drainage and reports it's not raised. Pt asked to take picture of it and show therapist at next appt. Explained s/s of infection which pt denies. Pt instructed to temporarily stop retrograde massage and contrast bath until therapist can examine/assess further in person. However, pt can continue to elevate arm/hand prn, perform HEP, and wear compression glove prn at night. Pt verbalizes understanding

## 2023-11-25 ENCOUNTER — Other Ambulatory Visit (HOSPITAL_BASED_OUTPATIENT_CLINIC_OR_DEPARTMENT_OTHER): Payer: Self-pay

## 2023-11-26 ENCOUNTER — Ambulatory Visit: Admitting: Occupational Therapy

## 2023-11-26 DIAGNOSIS — M25532 Pain in left wrist: Secondary | ICD-10-CM | POA: Diagnosis not present

## 2023-11-26 DIAGNOSIS — R208 Other disturbances of skin sensation: Secondary | ICD-10-CM

## 2023-11-26 DIAGNOSIS — R278 Other lack of coordination: Secondary | ICD-10-CM

## 2023-11-26 DIAGNOSIS — M25632 Stiffness of left wrist, not elsewhere classified: Secondary | ICD-10-CM

## 2023-11-26 DIAGNOSIS — M6281 Muscle weakness (generalized): Secondary | ICD-10-CM
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
## 2023-11-27 ENCOUNTER — Other Ambulatory Visit (HOSPITAL_BASED_OUTPATIENT_CLINIC_OR_DEPARTMENT_OTHER): Payer: Self-pay

## 2023-11-30 ENCOUNTER — Other Ambulatory Visit (HOSPITAL_BASED_OUTPATIENT_CLINIC_OR_DEPARTMENT_OTHER): Payer: Self-pay

## 2023-11-30 ENCOUNTER — Ambulatory Visit: Admitting: Occupational Therapy

## 2023-11-30 DIAGNOSIS — M25632 Stiffness of left wrist, not elsewhere classified: Secondary | ICD-10-CM

## 2023-11-30 DIAGNOSIS — M25532 Pain in left wrist: Secondary | ICD-10-CM

## 2023-11-30 DIAGNOSIS — M6281 Muscle weakness (generalized): Secondary | ICD-10-CM

## 2023-11-30 DIAGNOSIS — R278 Other lack of coordination: Secondary | ICD-10-CM

## 2023-11-30 DIAGNOSIS — R600 Localized edema: Secondary | ICD-10-CM

## 2023-11-30 DIAGNOSIS — R208 Other disturbances of skin sensation: Secondary | ICD-10-CM

## 2023-11-30 MED ORDER — MELOXICAM 15 MG PO TABS
15.0000 mg | ORAL_TABLET | Freq: Every day | ORAL | 1 refills | Status: DC
  Filled 2023-11-30: qty 30, 30d supply, fill #0
  Filled 2023-12-26: qty 30, 30d supply, fill #1
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
## 2023-12-08 ENCOUNTER — Ambulatory Visit: Admitting: Occupational Therapy

## 2023-12-08 DIAGNOSIS — R600 Localized edema: Secondary | ICD-10-CM

## 2023-12-08 DIAGNOSIS — M25532 Pain in left wrist: Secondary | ICD-10-CM | POA: Diagnosis not present

## 2023-12-08 DIAGNOSIS — M25632 Stiffness of left wrist, not elsewhere classified: Secondary | ICD-10-CM

## 2023-12-08 DIAGNOSIS — M6281 Muscle weakness (generalized): Secondary | ICD-10-CM

## 2023-12-08 DIAGNOSIS — R278 Other lack of coordination: Secondary | ICD-10-CM

## 2023-12-08 DIAGNOSIS — R208 Other disturbances of skin sensation: Secondary | ICD-10-CM
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
## 2023-12-15 ENCOUNTER — Ambulatory Visit: Admitting: Occupational Therapy

## 2023-12-15 ENCOUNTER — Encounter: Payer: Self-pay | Admitting: Occupational Therapy

## 2023-12-15 DIAGNOSIS — R278 Other lack of coordination: Secondary | ICD-10-CM

## 2023-12-15 DIAGNOSIS — R600 Localized edema: Secondary | ICD-10-CM

## 2023-12-15 DIAGNOSIS — R208 Other disturbances of skin sensation: Secondary | ICD-10-CM

## 2023-12-15 DIAGNOSIS — M25532 Pain in left wrist: Secondary | ICD-10-CM | POA: Diagnosis not present

## 2023-12-15 DIAGNOSIS — M25632 Stiffness of left wrist, not elsewhere classified: Secondary | ICD-10-CM

## 2023-12-15 DIAGNOSIS — M6281 Muscle weakness (generalized): Secondary | ICD-10-CM
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
## 2023-12-17 ENCOUNTER — Other Ambulatory Visit (HOSPITAL_BASED_OUTPATIENT_CLINIC_OR_DEPARTMENT_OTHER): Payer: Self-pay

## 2023-12-21 ENCOUNTER — Other Ambulatory Visit (HOSPITAL_BASED_OUTPATIENT_CLINIC_OR_DEPARTMENT_OTHER): Payer: Self-pay

## 2023-12-21 ENCOUNTER — Other Ambulatory Visit: Payer: Self-pay

## 2023-12-21 MED ORDER — PREGABALIN 75 MG PO CAPS
75.0000 mg | ORAL_CAPSULE | Freq: Two times a day (BID) | ORAL | 1 refills | Status: AC
  Filled 2023-12-21: qty 60, 30d supply, fill #0

## 2023-12-22 ENCOUNTER — Ambulatory Visit: Attending: Surgery | Admitting: Occupational Therapy

## 2023-12-22 DIAGNOSIS — R208 Other disturbances of skin sensation: Secondary | ICD-10-CM | POA: Diagnosis present

## 2023-12-22 DIAGNOSIS — M6281 Muscle weakness (generalized): Secondary | ICD-10-CM | POA: Insufficient documentation

## 2023-12-22 DIAGNOSIS — M25632 Stiffness of left wrist, not elsewhere classified: Secondary | ICD-10-CM | POA: Insufficient documentation

## 2023-12-22 DIAGNOSIS — M25532 Pain in left wrist: Secondary | ICD-10-CM | POA: Insufficient documentation

## 2023-12-22 DIAGNOSIS — R600 Localized edema: Secondary | ICD-10-CM | POA: Diagnosis present

## 2023-12-22 DIAGNOSIS — R278 Other lack of coordination: Secondary | ICD-10-CM | POA: Insufficient documentation
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
## 2023-12-26 ENCOUNTER — Other Ambulatory Visit (HOSPITAL_BASED_OUTPATIENT_CLINIC_OR_DEPARTMENT_OTHER): Payer: Self-pay

## 2023-12-28 ENCOUNTER — Other Ambulatory Visit (HOSPITAL_BASED_OUTPATIENT_CLINIC_OR_DEPARTMENT_OTHER): Payer: Self-pay

## 2023-12-28 ENCOUNTER — Ambulatory Visit: Admitting: Occupational Therapy

## 2024-01-04 ENCOUNTER — Ambulatory Visit: Admitting: Occupational Therapy

## 2024-01-04 ENCOUNTER — Other Ambulatory Visit (HOSPITAL_BASED_OUTPATIENT_CLINIC_OR_DEPARTMENT_OTHER): Payer: Self-pay

## 2024-01-04 DIAGNOSIS — R208 Other disturbances of skin sensation: Secondary | ICD-10-CM

## 2024-01-04 DIAGNOSIS — M25632 Stiffness of left wrist, not elsewhere classified: Secondary | ICD-10-CM

## 2024-01-04 DIAGNOSIS — M25532 Pain in left wrist: Secondary | ICD-10-CM | POA: Diagnosis not present

## 2024-01-04 DIAGNOSIS — R278 Other lack of coordination: Secondary | ICD-10-CM

## 2024-01-04 DIAGNOSIS — M6281 Muscle weakness (generalized): Secondary | ICD-10-CM

## 2024-01-04 MED ORDER — SEMAGLUTIDE(0.25 OR 0.5MG/DOS) 2 MG/3ML ~~LOC~~ SOPN
0.5000 mg | PEN_INJECTOR | SUBCUTANEOUS | 0 refills | Status: DC
  Filled 2024-01-04: qty 3, 28d supply, fill #0

## 2024-01-04 MED ORDER — PREGABALIN 75 MG PO CAPS
75.0000 mg | ORAL_CAPSULE | Freq: Three times a day (TID) | ORAL | 1 refills | Status: AC
  Filled 2024-01-04: qty 90, 30d supply, fill #0
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
## 2024-01-05 ENCOUNTER — Other Ambulatory Visit (HOSPITAL_BASED_OUTPATIENT_CLINIC_OR_DEPARTMENT_OTHER): Payer: Self-pay

## 2024-01-05 ENCOUNTER — Encounter (HOSPITAL_BASED_OUTPATIENT_CLINIC_OR_DEPARTMENT_OTHER): Payer: Self-pay

## 2024-01-05 MED ORDER — VITAMIN D (ERGOCALCIFEROL) 1.25 MG (50000 UNIT) PO CAPS
50000.0000 [IU] | ORAL_CAPSULE | ORAL | 1 refills | Status: AC
  Filled 2024-01-05: qty 21, 74d supply, fill #0
  Filled 2024-03-15 – 2024-05-20 (×2): qty 21, 74d supply, fill #1

## 2024-01-05 MED ORDER — MELOXICAM 15 MG PO TABS
15.0000 mg | ORAL_TABLET | Freq: Every day | ORAL | 0 refills | Status: DC
  Filled 2024-01-05 – 2024-01-29 (×2): qty 30, 30d supply, fill #0

## 2024-01-05 MED ORDER — LOSARTAN POTASSIUM 25 MG PO TABS
25.0000 mg | ORAL_TABLET | Freq: Every day | ORAL | 1 refills | Status: AC
  Filled 2024-01-05: qty 90, 90d supply, fill #0
  Filled 2024-03-15 – 2024-03-30 (×2): qty 90, 90d supply, fill #1

## 2024-01-05 MED ORDER — PREGABALIN 75 MG PO CAPS
75.0000 mg | ORAL_CAPSULE | Freq: Three times a day (TID) | ORAL | 1 refills | Status: AC
  Filled 2024-01-05 – 2024-01-19 (×2): qty 90, 30d supply, fill #0

## 2024-01-06 ENCOUNTER — Other Ambulatory Visit (HOSPITAL_BASED_OUTPATIENT_CLINIC_OR_DEPARTMENT_OTHER): Payer: Self-pay

## 2024-01-06 MED ORDER — CETIRIZINE HCL 10 MG PO TABS
10.0000 mg | ORAL_TABLET | Freq: Every day | ORAL | 1 refills | Status: AC
  Filled 2024-01-06: qty 30, 30d supply, fill #0
  Filled 2024-02-13: qty 30, 30d supply, fill #1
  Filled 2024-03-15 – 2024-03-30 (×2): qty 30, 30d supply, fill #2
  Filled 2024-05-20: qty 30, 30d supply, fill #3

## 2024-01-11 ENCOUNTER — Ambulatory Visit: Admitting: Occupational Therapy

## 2024-01-11 DIAGNOSIS — M6281 Muscle weakness (generalized): Secondary | ICD-10-CM

## 2024-01-11 DIAGNOSIS — R208 Other disturbances of skin sensation: Secondary | ICD-10-CM

## 2024-01-11 DIAGNOSIS — M25532 Pain in left wrist: Secondary | ICD-10-CM

## 2024-01-11 DIAGNOSIS — R600 Localized edema: Secondary | ICD-10-CM

## 2024-01-11 DIAGNOSIS — R278 Other lack of coordination: Secondary | ICD-10-CM

## 2024-01-11 DIAGNOSIS — M25632 Stiffness of left wrist, not elsewhere classified: Secondary | ICD-10-CM

## 2024-01-11 NOTE — Patient Instructions (Signed)
[id number] (8/07) - Page 1 of 6 [facility]  Rehabilitation and Sports Medicine Services Joint Protection Principles Therapist Phone  Joint protection principles are a series of techniques which can be included into all activities. This will reduce the stress on your joints. Joints that have been weakened by arthritis are at risk of being damaged by stress and strain. Improper use of diseased joints may lead to impaired function and deformity. Joint protection techniques are ways of doing activities so that the risk of deformity is decreased. 1. Respect For Pain Stop activities before you reach the point of discomfort or pain. Limit activities which cause your pain to last more than one hour after you have stopped the activity. 2. Balance Activity And Rest Rest before becoming tired. Plan rest periods during longer or more difficult activities. By resting 10 minutes during an activity, you will have more energy to continue. 3. Avoid Activities Which Cannot Be Stopped When you begin to feel joint pain, stop. This will eliminate excessive pain and fatigue later. Prioritize activities. Consider the activity, length of time, and difficulty before beginning. Plan difficult activities for "peak" energy times. 4. Use Larger, Stronger Joints For Activities, When Possible, Distributing The Weight Over Non-involved Or Stronger Joints.  To lift a bag from a counter, bend your knees, hug the bag with both arms. Bend your elbows so that the bag is held tightly to your chest and straighten your knees. Keep hold on the bag by keeping your elbows bent. If the load is too heavy, push shopping cart, or get help with groceries - use drive-up service.  You can use your hip to push open doors, and your feet to close lower drawers. OVER  [id number] (8/07) - Page 2 of 6  An envelope briefcase with a snap lock can be used rather than an attache case. By bending the elbows, the case can be carried under the arm so  that the case rests on the forearm. Hold the case by resting your arm against your body. Switch the case from one side of the body to the other.  Use the larger joints (elbow or shoulder) to carry the weight of the purse.  Wrong: The weight of the purse is all on the weak fingers.  Wing faucets: Keeping wrists extended, use the palm of your left hand to turn on a left faucet and the back of your left hand to turn the faucet off.  Four-pronged or circular faucets: Place palm of hand on top of the faucet keeping fingers extended. Straighten your elbow and apply a downward force on faucet, pushing from your shoulder. Keeping fingers and elbow extended, turn your arm inward toward your thumb. Right: The stronger elbow should carry the weight of the purse. CONTINUED ON NEXT PAGE W11914 (8/07) - Page 3 of 6  Wrong: Do not use fingers to lift heavy roasting pans or dishes.  Wrong: All the weight of the pot would on your weak fingers. 5. Avoid Staying In One Position For Extended Periods Of Time. Plan rest periods. Change your position. Stretch and relax your joints. 6. Maintain Or Use Your Joints In Good Alignment. Maintain proper posture.  This is good alignment. Right: Use oven mitts and lift with palms, using the stronger wrists and elbows to do the work. Right: Pick up the pot with two hands, using your palms. Avoid or change activities that cause your fingers to move towards the little finger side of your hand. OVER  Z61096 (8/07) - Page 4 of 6  Use the palms of your hands for lifting and pushing. Push instead of pulling. 7. Maintain Proper Weight. Additional weight can stress weight-bearing joints (hip, knees, feet, back). Special Considerations For The Hands  1. AVOID TIGHT GRASP. Use a relaxed grip. Enlarge handles.  Place palm of hand on jar lid, and using weight if body, turn arm at shoulder to open jar. A sponge or wet towel under the jar prevents sliding.  2. AVOID PRESSURE ON  BACK OF KNUCKLES (MP JOINTS).  Wrong Right When rising from chair or bed. Dishwashing should be done with fingers kept straight as much as possible. It a dishwasher is available, use it in preference to washing by hand. Hold the knife or mixing spoon like a dagger, with the handle parallel to knuckles. Cutting is then changed from sawing to pulling. CONTINUED ON NEXT PAGE [id number] (8/07) - Page 5 of 6  3. USE BOTH HANDS WHEN POSSIBLE  4. AVOID REPETITIVE HAND ACTIVITIES Take breaks Change activity, i.e. using screwdriver, crocheting.  5. AVOID PRESSURE TO TIP OF THUMB Example: pushing snaps together, opening car doors, ringing doorbells.  To protect thumb joints, open milk containers with heels of the hands rather than thumbs. PostureWhether walking, standing, sitting or even sleeping, good posture is important for people with arthritis. Poor posture can make arthritis worse. As for standing, you should stand straight, head high, shoulders back, stomach in, and hips and knees straight. WalkingWalk erect, as in standing position. Arm swing freely at sides; let your weight shift easily from side to the other. Don't carry heavy packages in one hand. A lightweight shoulder bag is a good idea. If legs or knees are involved, a cane will make walking easier. Ring top cans: Hold the can with one hand. With the other hand, place a knife through the ring with handle of knife directly over the opening. Using the palm of your hand, push down on the handle of the knife. OVER W11914 (8/07) - Page 6 of 6  Resting/SleepingPatients with rheumatoid arthritis should avoid bent knees or arms. Lie straight at sides, knees and hips straight. Use a firm mattress or put plywood board between mattress and bedspring. If you need a pillow under your head, use a thin one. Keep sheets and blankets loose over your feet, perhaps by using a blanket support. If your arthritis is in your back, you may need a different position for  sleeping. Ask your doctor. SittingKeep good posture when sitting down. Use straight-back armchairs with firm seats. Sit with head up, shoulders back, stomach in, feet flat on floor. Use arms of chair to stand up slowly. References AOTA's, Workbook for Consumers with R.A.   Cordery, Vanita Panda, M.A.O.T., OTR, "Joint Protection - A Responsibility for the Occupational Therapist," American Journal of Occupational Therapy, XIX, %, 1965.   "Joint Protection," Occupational Therapy Department, [facility], [place], [place].   Alesia Morin: Rheumatic Disease Occupational Therapy and Rehab, [phone].   "Principles of Joint Protection," Occupational Therapy Department, [facility], [place], [place].   [employer], 5621.   Sammuel Cooper, OTR, and Tonye Pearson, OTR, "Joint Preservation Techniques for Patients with Rheumatoid Arthritis," Department of Occupational Therapy, [facility], [place], [place].   You can place your arms out to the sides with pillows underneath or place a pillow across your stomach with your hands resting on top for support.   Hold a pillow with  your arms away from your body.  Do not bend your arms at the elbows or tuck your hands under your head. Do not place your hand on top or underneath pillow. Use outside of pillow as a border.
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
## 2024-01-19 ENCOUNTER — Other Ambulatory Visit (HOSPITAL_BASED_OUTPATIENT_CLINIC_OR_DEPARTMENT_OTHER): Payer: Self-pay

## 2024-01-19 ENCOUNTER — Ambulatory Visit: Admitting: Occupational Therapy

## 2024-01-19 MED ORDER — PREGABALIN 75 MG PO CAPS
75.0000 mg | ORAL_CAPSULE | Freq: Three times a day (TID) | ORAL | 1 refills | Status: AC
  Filled 2024-02-16: qty 90, 30d supply, fill #0
  Filled 2024-03-18: qty 90, 30d supply, fill #1
  Filled 2024-03-18: qty 38, 12d supply, fill #1
  Filled 2024-03-18: qty 52, 18d supply, fill #1

## 2024-01-29 ENCOUNTER — Other Ambulatory Visit (HOSPITAL_BASED_OUTPATIENT_CLINIC_OR_DEPARTMENT_OTHER): Payer: Self-pay

## 2024-01-29 ENCOUNTER — Other Ambulatory Visit (HOSPITAL_COMMUNITY): Payer: Self-pay

## 2024-01-29 MED ORDER — METFORMIN HCL ER 500 MG PO TB24
500.0000 mg | ORAL_TABLET | Freq: Every day | ORAL | 1 refills | Status: AC
  Filled 2024-01-29: qty 90, 90d supply, fill #0
  Filled 2024-05-20: qty 90, 90d supply, fill #1

## 2024-02-01 ENCOUNTER — Other Ambulatory Visit (HOSPITAL_BASED_OUTPATIENT_CLINIC_OR_DEPARTMENT_OTHER): Payer: Self-pay

## 2024-02-01 ENCOUNTER — Other Ambulatory Visit: Payer: Self-pay

## 2024-02-13 ENCOUNTER — Other Ambulatory Visit (HOSPITAL_BASED_OUTPATIENT_CLINIC_OR_DEPARTMENT_OTHER): Payer: Self-pay

## 2024-02-16 ENCOUNTER — Other Ambulatory Visit (HOSPITAL_BASED_OUTPATIENT_CLINIC_OR_DEPARTMENT_OTHER): Payer: Self-pay

## 2024-02-19 ENCOUNTER — Other Ambulatory Visit (HOSPITAL_BASED_OUTPATIENT_CLINIC_OR_DEPARTMENT_OTHER): Payer: Self-pay

## 2024-03-15 ENCOUNTER — Other Ambulatory Visit (HOSPITAL_BASED_OUTPATIENT_CLINIC_OR_DEPARTMENT_OTHER): Payer: Self-pay

## 2024-03-18 ENCOUNTER — Other Ambulatory Visit (HOSPITAL_BASED_OUTPATIENT_CLINIC_OR_DEPARTMENT_OTHER): Payer: Self-pay

## 2024-03-18 ENCOUNTER — Other Ambulatory Visit: Payer: Self-pay

## 2024-03-25 ENCOUNTER — Other Ambulatory Visit (HOSPITAL_BASED_OUTPATIENT_CLINIC_OR_DEPARTMENT_OTHER): Payer: Self-pay

## 2024-03-28 ENCOUNTER — Other Ambulatory Visit (HOSPITAL_BASED_OUTPATIENT_CLINIC_OR_DEPARTMENT_OTHER): Payer: Self-pay

## 2024-03-30 ENCOUNTER — Other Ambulatory Visit (HOSPITAL_BASED_OUTPATIENT_CLINIC_OR_DEPARTMENT_OTHER): Payer: Self-pay

## 2024-03-30 MED ORDER — MELOXICAM 15 MG PO TABS
15.0000 mg | ORAL_TABLET | Freq: Every day | ORAL | 2 refills | Status: AC
  Filled 2024-03-30 – 2024-05-05 (×2): qty 30, 30d supply, fill #0

## 2024-03-30 MED ORDER — PREGABALIN 75 MG PO CAPS
75.0000 mg | ORAL_CAPSULE | Freq: Three times a day (TID) | ORAL | 1 refills | Status: AC
  Filled 2024-03-30: qty 90, 30d supply, fill #0

## 2024-04-12 ENCOUNTER — Other Ambulatory Visit (HOSPITAL_BASED_OUTPATIENT_CLINIC_OR_DEPARTMENT_OTHER): Payer: Self-pay

## 2024-04-25 ENCOUNTER — Other Ambulatory Visit (HOSPITAL_BASED_OUTPATIENT_CLINIC_OR_DEPARTMENT_OTHER): Payer: Self-pay

## 2024-04-25 MED ORDER — PREGABALIN 75 MG PO CAPS
75.0000 mg | ORAL_CAPSULE | Freq: Three times a day (TID) | ORAL | 1 refills | Status: AC
  Filled 2024-04-25 – 2024-05-05 (×2): qty 90, 30d supply, fill #0

## 2024-05-05 ENCOUNTER — Other Ambulatory Visit (HOSPITAL_BASED_OUTPATIENT_CLINIC_OR_DEPARTMENT_OTHER): Payer: Self-pay

## 2024-05-05 MED ORDER — LEVOTHYROXINE SODIUM 175 MCG PO TABS
175.0000 ug | ORAL_TABLET | Freq: Every day | ORAL | 2 refills | Status: AC
  Filled 2024-05-05 – 2024-05-20 (×2): qty 90, 90d supply, fill #0

## 2024-05-06 ENCOUNTER — Other Ambulatory Visit (HOSPITAL_BASED_OUTPATIENT_CLINIC_OR_DEPARTMENT_OTHER): Payer: Self-pay

## 2024-05-09 ENCOUNTER — Other Ambulatory Visit (HOSPITAL_BASED_OUTPATIENT_CLINIC_OR_DEPARTMENT_OTHER): Payer: Self-pay

## 2024-05-09 MED ORDER — ERYTHROMYCIN 5 MG/GM OP OINT
TOPICAL_OINTMENT | OPHTHALMIC | 1 refills | Status: AC
  Filled 2024-05-09: qty 3.5, 7d supply, fill #0

## 2024-05-11 ENCOUNTER — Other Ambulatory Visit (HOSPITAL_BASED_OUTPATIENT_CLINIC_OR_DEPARTMENT_OTHER): Payer: Self-pay

## 2024-05-11 ENCOUNTER — Encounter (HOSPITAL_COMMUNITY): Payer: Self-pay | Admitting: Pharmacist

## 2024-05-11 MED ORDER — REPATHA SURECLICK 140 MG/ML ~~LOC~~ SOAJ
140.0000 mg | SUBCUTANEOUS | 2 refills | Status: DC
  Filled 2024-05-11: qty 2, 28d supply, fill #0

## 2024-05-13 ENCOUNTER — Other Ambulatory Visit (HOSPITAL_BASED_OUTPATIENT_CLINIC_OR_DEPARTMENT_OTHER): Payer: Self-pay

## 2024-05-13 MED ORDER — WEGOVY 0.5 MG/0.5ML ~~LOC~~ SOAJ
0.5000 mg | SUBCUTANEOUS | 1 refills | Status: DC
  Filled 2024-05-13: qty 2, 28d supply, fill #0

## 2024-05-14 ENCOUNTER — Other Ambulatory Visit (HOSPITAL_BASED_OUTPATIENT_CLINIC_OR_DEPARTMENT_OTHER): Payer: Self-pay

## 2024-05-16 ENCOUNTER — Other Ambulatory Visit (HOSPITAL_BASED_OUTPATIENT_CLINIC_OR_DEPARTMENT_OTHER): Payer: Self-pay

## 2024-05-17 ENCOUNTER — Other Ambulatory Visit (HOSPITAL_BASED_OUTPATIENT_CLINIC_OR_DEPARTMENT_OTHER): Payer: Self-pay

## 2024-05-18 ENCOUNTER — Other Ambulatory Visit (HOSPITAL_BASED_OUTPATIENT_CLINIC_OR_DEPARTMENT_OTHER): Payer: Self-pay

## 2024-05-20 ENCOUNTER — Other Ambulatory Visit (HOSPITAL_BASED_OUTPATIENT_CLINIC_OR_DEPARTMENT_OTHER): Payer: Self-pay
